# Patient Record
Sex: Female | Born: 1985 | Race: White | Hispanic: Yes | Marital: Married | Smoking: Never smoker
Health system: Southern US, Community
[De-identification: ages and names within clinical notes are randomized; demographics above are authoritative.]

## PROBLEM LIST (undated history)

## (undated) ENCOUNTER — Inpatient Hospital Stay (HOSPITAL_COMMUNITY): Payer: Self-pay

## (undated) DIAGNOSIS — Z789 Other specified health status: Secondary | ICD-10-CM

---

## 2005-01-24 ENCOUNTER — Inpatient Hospital Stay (HOSPITAL_COMMUNITY): Admission: AD | Admit: 2005-01-24 | Discharge: 2005-01-27 | Payer: Self-pay | Admitting: Obstetrics

## 2005-01-25 ENCOUNTER — Encounter (INDEPENDENT_AMBULATORY_CARE_PROVIDER_SITE_OTHER): Payer: Self-pay | Admitting: Specialist

## 2005-12-18 ENCOUNTER — Ambulatory Visit: Payer: Self-pay | Admitting: Internal Medicine

## 2006-03-08 ENCOUNTER — Ambulatory Visit: Payer: Self-pay | Admitting: Internal Medicine

## 2006-05-31 ENCOUNTER — Ambulatory Visit: Payer: Self-pay | Admitting: Internal Medicine

## 2006-08-22 ENCOUNTER — Ambulatory Visit: Payer: Self-pay | Admitting: Internal Medicine

## 2006-11-09 ENCOUNTER — Encounter: Payer: Self-pay | Admitting: Internal Medicine

## 2006-11-13 ENCOUNTER — Ambulatory Visit: Payer: Self-pay | Admitting: Internal Medicine

## 2006-12-04 ENCOUNTER — Encounter (INDEPENDENT_AMBULATORY_CARE_PROVIDER_SITE_OTHER): Payer: Self-pay | Admitting: *Deleted

## 2007-02-04 ENCOUNTER — Ambulatory Visit: Payer: Self-pay | Admitting: Internal Medicine

## 2007-02-05 ENCOUNTER — Telehealth (INDEPENDENT_AMBULATORY_CARE_PROVIDER_SITE_OTHER): Payer: Self-pay | Admitting: *Deleted

## 2007-04-28 ENCOUNTER — Ambulatory Visit: Payer: Self-pay | Admitting: Internal Medicine

## 2007-07-21 ENCOUNTER — Ambulatory Visit: Payer: Self-pay | Admitting: Internal Medicine

## 2007-09-16 ENCOUNTER — Encounter (INDEPENDENT_AMBULATORY_CARE_PROVIDER_SITE_OTHER): Payer: Self-pay | Admitting: Internal Medicine

## 2007-09-16 ENCOUNTER — Ambulatory Visit: Payer: Self-pay | Admitting: Internal Medicine

## 2007-09-16 LAB — CONVERTED CEMR LAB
BUN: 14 mg/dL (ref 6–23)
CO2: 22 meq/L (ref 19–32)
Cholesterol: 132 mg/dL (ref 0–200)
Creatinine, Ser: 0.61 mg/dL (ref 0.40–1.20)
Eosinophils Relative: 1 % (ref 0–5)
GC Probe Amp, Genital: NEGATIVE
Glucose, Bld: 82 mg/dL (ref 70–99)
Glucose, Urine, Semiquant: NEGATIVE
HCT: 42.2 % (ref 36.0–46.0)
Hemoglobin: 14.2 g/dL (ref 12.0–15.0)
Lymphocytes Relative: 35 % (ref 12–46)
Lymphs Abs: 2.1 10*3/uL (ref 0.7–4.0)
Monocytes Absolute: 0.4 10*3/uL (ref 0.1–1.0)
Protein, U semiquant: 30
RDW: 13.1 % (ref 11.5–15.5)
Total Bilirubin: 0.6 mg/dL (ref 0.3–1.2)
Total CHOL/HDL Ratio: 2.2
Triglycerides: 41 mg/dL (ref <150)
VLDL: 8 mg/dL (ref 0–40)
WBC Urine, dipstick: NEGATIVE
WBC: 6.1 10*3/uL (ref 4.0–10.5)

## 2007-10-02 ENCOUNTER — Encounter (INDEPENDENT_AMBULATORY_CARE_PROVIDER_SITE_OTHER): Payer: Self-pay | Admitting: Internal Medicine

## 2007-10-10 ENCOUNTER — Encounter (INDEPENDENT_AMBULATORY_CARE_PROVIDER_SITE_OTHER): Payer: Self-pay | Admitting: Internal Medicine

## 2007-10-13 ENCOUNTER — Ambulatory Visit: Payer: Self-pay | Admitting: Internal Medicine

## 2008-01-05 ENCOUNTER — Ambulatory Visit: Payer: Self-pay | Admitting: Internal Medicine

## 2008-03-29 ENCOUNTER — Ambulatory Visit: Payer: Self-pay | Admitting: Internal Medicine

## 2008-06-21 ENCOUNTER — Ambulatory Visit: Payer: Self-pay | Admitting: Internal Medicine

## 2008-09-14 ENCOUNTER — Ambulatory Visit: Payer: Self-pay | Admitting: Internal Medicine

## 2008-12-06 ENCOUNTER — Ambulatory Visit: Payer: Self-pay | Admitting: Nurse Practitioner

## 2009-02-28 ENCOUNTER — Ambulatory Visit: Payer: Self-pay | Admitting: Internal Medicine

## 2009-05-27 ENCOUNTER — Ambulatory Visit: Payer: Self-pay | Admitting: Physician Assistant

## 2009-06-21 ENCOUNTER — Ambulatory Visit: Payer: Self-pay | Admitting: Internal Medicine

## 2009-08-09 ENCOUNTER — Ambulatory Visit: Payer: Self-pay | Admitting: Internal Medicine

## 2009-08-09 DIAGNOSIS — J9801 Acute bronchospasm: Secondary | ICD-10-CM | POA: Insufficient documentation

## 2009-08-09 DIAGNOSIS — J309 Allergic rhinitis, unspecified: Secondary | ICD-10-CM | POA: Insufficient documentation

## 2009-08-09 DIAGNOSIS — R5381 Other malaise: Secondary | ICD-10-CM

## 2009-08-09 DIAGNOSIS — R5383 Other fatigue: Secondary | ICD-10-CM

## 2009-08-09 LAB — CONVERTED CEMR LAB
Ketones, urine, test strip: NEGATIVE
Nitrite: NEGATIVE
Urobilinogen, UA: 0.2
Whiff Test: NEGATIVE

## 2009-08-11 ENCOUNTER — Encounter (INDEPENDENT_AMBULATORY_CARE_PROVIDER_SITE_OTHER): Payer: Self-pay | Admitting: Internal Medicine

## 2009-08-11 DIAGNOSIS — E079 Disorder of thyroid, unspecified: Secondary | ICD-10-CM | POA: Insufficient documentation

## 2009-08-11 LAB — CONVERTED CEMR LAB
ALT: 13 units/L (ref 0–35)
AST: 18 units/L (ref 0–37)
Albumin: 4.6 g/dL (ref 3.5–5.2)
CO2: 23 meq/L (ref 19–32)
Calcium: 9.5 mg/dL (ref 8.4–10.5)
Chloride: 108 meq/L (ref 96–112)
Creatinine, Ser: 0.72 mg/dL (ref 0.40–1.20)
Eosinophils Absolute: 0.1 10*3/uL (ref 0.0–0.7)
GC Probe Amp, Genital: NEGATIVE
Lymphocytes Relative: 29 % (ref 12–46)
Lymphs Abs: 1.7 10*3/uL (ref 0.7–4.0)
MCV: 88.5 fL (ref 78.0–100.0)
Monocytes Relative: 7 % (ref 3–12)
Neutrophils Relative %: 63 % (ref 43–77)
Potassium: 3.8 meq/L (ref 3.5–5.3)
RBC: 4.44 M/uL (ref 3.87–5.11)
Sodium: 142 meq/L (ref 135–145)
TSH: 0.293 microintl units/mL — ABNORMAL LOW (ref 0.350–4.500)
Total Protein: 7.2 g/dL (ref 6.0–8.3)
WBC: 5.7 10*3/uL (ref 4.0–10.5)

## 2009-08-18 ENCOUNTER — Ambulatory Visit: Payer: Self-pay | Admitting: Internal Medicine

## 2009-08-23 ENCOUNTER — Ambulatory Visit: Payer: Self-pay | Admitting: Internal Medicine

## 2009-12-09 ENCOUNTER — Ambulatory Visit: Payer: Self-pay | Admitting: Internal Medicine

## 2009-12-09 LAB — CONVERTED CEMR LAB
Free T4: 0.96 ng/dL (ref 0.80–1.80)
Preg, Serum: NEGATIVE
T3, Free: 2.9 pg/mL (ref 2.3–4.2)

## 2010-01-13 ENCOUNTER — Telehealth (INDEPENDENT_AMBULATORY_CARE_PROVIDER_SITE_OTHER): Payer: Self-pay | Admitting: Internal Medicine

## 2010-01-13 ENCOUNTER — Encounter (INDEPENDENT_AMBULATORY_CARE_PROVIDER_SITE_OTHER): Payer: Self-pay | Admitting: Internal Medicine

## 2010-01-16 ENCOUNTER — Encounter (INDEPENDENT_AMBULATORY_CARE_PROVIDER_SITE_OTHER): Payer: Self-pay | Admitting: *Deleted

## 2010-02-21 ENCOUNTER — Ambulatory Visit: Payer: Self-pay | Admitting: Internal Medicine

## 2010-02-24 ENCOUNTER — Ambulatory Visit: Payer: Self-pay | Admitting: Internal Medicine

## 2010-04-18 NOTE — Assessment & Plan Note (Signed)
Summary: DISCUSS ABOUT BIRTH CONTROL//GK   Vital Signs:  Patient profile:   25 year old female Weight:      110.06 pounds Temp:     98.7 degrees F Pulse rate:   57 / minute Pulse rhythm:   regular Resp:     16 per minute BP sitting:   99 / 62  (left arm) Cuff size:   regular  Vitals Entered By: Chauncy Passy, SMA CC: Pt. is here b/c she has some concernrs about the Depo shot. She wants to know if it's ok that she has not had a menstrual in over 3 years. She wants to know if the Depo shot affects the sexual desire since she believes she has lost sexual desire. She wants to know if there is another method that she can have/take.  Is Patient Diabetic? No Pain Assessment Patient in pain? no       Does patient need assistance? Functional Status Self care Ambulation Normal   CC:  Pt. is here b/c she has some concernrs about the Depo shot. She wants to know if it's ok that she has not had a menstrual in over 3 years. She wants to know if the Depo shot affects the sexual desire since she believes she has lost sexual desire. She wants to know if there is another method that she can have/take. Marland Kitchen  History of Present Illness: 1.  Contraceptive Management: Questions above regarding Depo provera.  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  Not interested in BCPs.    Current Medications (verified): 1)  Depo-Provera 150 Mg/ml Im Susp (Medroxyprogesterone Acetate) .Marland Kitchen.. 1 Ml Im Q 3 Mos.  Allergies (verified): No Known Drug Allergies  Physical Exam  General:  NAD   Impression & Recommendations:  Problem # 1:  CONTRACEPTIVE MANAGEMENT (ICD-V25.09) Discussed at length her options Not interested in BCPs. May continue the Depoprovera and should not affect fertility.  May have a delay in resuming cycle after stopping--have had some go for a year before resuming. Discussed IUD--[facility]--do not know the charge for that currently. Pt.  needs to follow up for CPP and will let us know her decision then  Complete Medication List: 1)  Depo-provera 150 Mg/ml Im Susp (Medroxyprogesterone acetate) .Marland Kitchen.. 1 ml im q 3 mos.  Patient Instructions: 1)  CPP with Dr. Delrae Alfred in next 2-3 months before due for Depo provera.

## 2010-04-18 NOTE — Assessment & Plan Note (Signed)
Summary: *F/U THROAT & BIRTH CONTROL / NS   Vital Signs:  Patient profile:   25 year old female Menstrual status:  DEPO Weight:      108.8 pounds Temp:     98.3 degrees F oral Pulse rate:   82 / minute BP sitting:   96 / 72  (left arm) Cuff size:   regular  Vitals Entered By: Michelle Nasuti (December 09, 2009 4:13 PM) CC: CONCERNSWITH THROAT AND DEPO Pain Assessment Patient in pain? no       Does patient need assistance? Ambulation Normal     Menstrual Status DEPO Last PAP Result NEGATIVE FOR INTRAEPITHELIAL LESIONS OR MALIGNANCY.   CC:  CONCERNSWITH THROAT AND DEPO.  History of Present Illness: 1.  Birth Control:  Here for Depo provera--missed visit last month when she was due as she did not have eligibility.  Pt. and husband have been using condoms every time had intercourse this past month.  2.  Abnormal TSH:  here to have recheck of thyroid hormones.  3.  Throat always scratchy:  Has been out of allergy meds for some time--could not afford until had eligibility.  Did continue with scratchy throat even when on meds, though improved.  Pt. also notes she has difficulty hitting high notes and difficulty with some chest tightness.  Does not feel like she can get a full deep breath.  No symptoms of heartburn or brash taste in mouth in morning.  Allergies (verified): No Known Drug Allergies  Physical Exam  General:  NAD, mildly hoarse and breathy sounding. Eyes:  No corneal or conjunctival inflammation noted. EOMI. Perrla. Funduscopic exam benign, without hemorrhages, exudates or papilledema. Vision grossly normal. Ears:  Mild cerumen impaction bilaterally Nose:  Mucosa with some swelling and clear discharge Mouth:  pharynx pink and moist.  Unable to see posterior pharynx well enough to evaluate Neck:  No deformities, masses, or tenderness noted.  No thyromegaly or mass Lungs:  Normal respiratory effort, chest expands symmetrically. Lungs are clear to auscultation, no  crackles or wheezes. Heart:  Normal rate and regular rhythm. S1 and S2 normal without gallop, murmur, click, rub or other extra sounds.   Impression & Recommendations:  Problem # 1:  THYROID STIMULATING HORMONE, ABNORMAL (ICD-246.9)  Orders: T-TSH (30865-78469) T-T4, Free (62952-84132) T- * Misc. Laboratory test [phone])  Problem # 2:  ALLERGIC RHINITIS (ICD-477.9)  Her updated medication list for this problem includes:    Xyzal 5 Mg Tabs (Levocetirizine dihydrochloride) .Marland Kitchen... 1 tab by mouth daily    Nasacort Aq 55 Mcg/act Aers (Triamcinolone acetonide) .Marland Kitchen... 2 sprays each nostril daily  Problem # 3:  CONTRACEPTIVE MANAGEMENT (ICD-V25.09) Does not feel she can afford IUD--will stick with depo provera, but will need to check pregnancy first as missed a month. Orders: T-Pregnancy (Serum), Qual.  [phone])  Complete Medication List: 1)  Depo-provera 150 Mg/ml Im Susp (Medroxyprogesterone acetate) .Marland Kitchen.. 1 ml im q 3 mos. 2)  Xyzal 5 Mg Tabs (Levocetirizine dihydrochloride) .Marland Kitchen.. 1 tab by mouth daily 3)  Ventolin Hfa 108 (90 Base) Mcg/act Aers (Albuterol sulfate) .... 2 puffs every 4 hours as needed 4)  Advair Diskus 100-50 Mcg/dose Aepb (Fluticasone-salmeterol) .Marland Kitchen.. 1 inhalation two times a day 5)  Nasacort Aq 55 Mcg/act Aers (Triamcinolone acetonide) .... 2 sprays each nostril daily  Patient Instructions: 1)  Follow up with Dr. Delrae Alfred in 2 months --allergies/hoarseness Prescriptions: NASACORT AQ 55 MCG/ACT AERS (TRIAMCINOLONE ACETONIDE) 2 sprays each nostril daily  #1 x 11  Entered and Authorized by:   Julieanne Manson MD   Signed by:   Julieanne Manson MD on 12/09/2009   Method used:   Faxed to ...       [facility] (retail)       [address] [place], [place]  [address]       Ph: [phone]       Fax: [phone]   RxID:   [id number] XYZAL 5 MG TABS (LEVOCETIRIZINE DIHYDROCHLORIDE) 1 tab by mouth daily  #30 x  11   Entered and Authorized by:   Julieanne Manson MD   Signed by:   Julieanne Manson MD on 12/09/2009   Method used:   Faxed to ...       [facility] (retail)       [address] [place], [place]  [address]       Ph: [phone]       Fax: [phone]   RxID:   [id number]   Appended Document: *F/U THROAT & BIRTH CONTROL / NS Pt. to return on Monday for Depo if urine pregnancy is negative.  Appended Document: *F/U THROAT & BIRTH CONTROL / NS no rx for depo was sent to [facility].

## 2010-04-18 NOTE — Assessment & Plan Note (Signed)
Summary: DEPO INJCETION/ JUNE 2ND//GK   Nurse Visit   Allergies: No Known Drug Allergies  Medication Administration  Injection # 1:    Medication: Depo-Provera 150mg     Diagnosis: CONTRACEPTIVE MANAGEMENT (ICD-V25.09)    Route: IM    Site: R deltoid    Exp Date: 03/19/2011    Lot #: [id number]    Mfr: [employer]    Comments: [phone] next shot due 11/09/09    Given by: Vesta Mixer CMA (August 18, 2009 3:57 PM)  Orders Added: 1)  Est. Patient Nurse visit [09003] 2)  Depo-Provera 150mg  [J1055] 3)  Admin of Therapeutic Inj  intramuscular or subcutaneous [96372]   Medication Administration  Injection # 1:    Medication: Depo-Provera 150mg     Diagnosis: CONTRACEPTIVE MANAGEMENT (ICD-V25.09)    Route: IM    Site: R deltoid    Exp Date: 03/19/2011    Lot #: [id number]    Mfr: [employer]    Comments: [phone] next shot due 11/09/09    Given by: Vesta Mixer CMA (August 18, 2009 3:57 PM)  Orders Added: 1)  Est. Patient Nurse visit [09003] 2)  Depo-Provera 150mg  [J1055] 3)  Admin of Therapeutic Inj  intramuscular or subcutaneous [30865]

## 2010-04-18 NOTE — Progress Notes (Signed)
Summary: Office Visit//depression screening  Office Visit//depression screening   Imported By: Arta Bruce 09/26/2009 11:21:12  _____________________________________________________________________  External Attachment:    Type:   Image     Comment:   External Document

## 2010-04-18 NOTE — Assessment & Plan Note (Signed)
Summary: FOLLOW UP VISIT 1 1/2 WEEKS///BC   Vital Signs:  Patient profile:   25 year old female Weight:      106 pounds BMI:     20.43 Temp:     98.4 degrees F  Vitals Entered By: Vesta Mixer CMA (August 23, 2009 4:01 PM) CC: Listen to lungs only as card is expired. Is Patient Diabetic? No  Does patient need assistance? Ambulation Normal   CC:  Listen to lungs only as card is expired.Marland Kitchen  History of Present Illness: 1.  Allergies with bronchospasm:  Ran out of all samples 4 days ago.  Her fatigue and breathing symptoms were very much better while taking.  Out of eligibility now, so will be difficult to get meds filled.  2.  Low TSH with normal free T4 and T3.  Has an appt. to recheck TSH 8/10  Allergies: No Known Drug Allergies  Physical Exam  General:  NAD Eyes:  No corneal or conjunctival inflammation noted. EOMI. Perrla. Funduscopic exam benign, without hemorrhages, exudates or papilledema. Vision grossly normal. Ears:  External ear exam shows no significant lesions or deformities.  Otoscopic examination reveals clear canals, tympanic membranes are intact bilaterally without bulging, retraction, inflammation or discharge. Hearing is grossly normal bilaterally. Nose:  External nasal examination shows no deformity or inflammation. Nasal mucosa are pink and moist without lesions or exudates. Mouth:  pharynx pink and moist.   Neck:  No deformities, masses, or tenderness noted. Lungs:  Normal respiratory effort, chest expands symmetrically. Lungs are clear to auscultation, no crackles or wheezes. Heart:  Normal rate and regular rhythm. S1 and S2 normal without gallop, murmur, click, rub or other extra sounds.   Impression & Recommendations:  Problem # 1:  ACUTE BRONCHOSPASM (ICD-519.11) Resolved Samples of Advair and Ventolin for 1 month given--to call at end of month to refill  Problem # 2:  ALLERGIC RHINITIS (ICD-477.9) Controlled--discussed pt. should get Claritin otc  until has eligibility Her updated medication list for this problem includes:    Claritin 10 Mg Tabs (Loratadine) .Marland Kitchen... 1 tab by mouth daily  Problem # 3:  THYROID STIMULATING HORMONE, ABNORMAL (ICD-246.9) Recheck in August  Complete Medication List: 1)  Depo-provera 150 Mg/ml Im Susp (Medroxyprogesterone acetate) .Marland Kitchen.. 1 ml im q 3 mos. 2)  Claritin 10 Mg Tabs (Loratadine) .Marland Kitchen.. 1 tab by mouth daily 3)  Ventolin Hfa 108 (90 Base) Mcg/act Aers (Albuterol sulfate) .... 2 puffs every 4 hours as needed 4)  Advair Diskus 100-50 Mcg/dose Aepb (Fluticasone-salmeterol) .Marland Kitchen.. 1 inhalation two times a day  Patient Instructions: 1)  Eligibility appt. 2)  Appt with Dr. Delrae Alfred after eligibility appt. Prescriptions: VENTOLIN HFA 108 (90 BASE) MCG/ACT AERS (ALBUTEROL SULFATE) 2 puffs every 4 hours as needed  #1 x 0   Entered and Authorized by:   Julieanne Manson MD   Signed by:   Julieanne Manson MD on 08/23/2009   Method used:   Samples Given   RxID:   [id number] ADVAIR DISKUS 100-50 MCG/DOSE AEPB (FLUTICASONE-SALMETEROL) 1 inhalation two times a day  #1 month x 0   Entered and Authorized by:   Julieanne Manson MD   Signed by:   Julieanne Manson MD on 08/23/2009   Method used:   Samples Given   RxID:   [id number]

## 2010-04-18 NOTE — Letter (Signed)
Summary: *HSN Results Follow up  [facility]  [address] [place], [place] [address]   Phone: [phone]  Fax: [phone]      01/16/2010   [facility] [address] [place], [place]  [address]   Dear  Ms. Ileen MENDEZ,                            ____S.Drinkard,FNP   ____D. Gore,FNP       ____B. McPherson,MD   ____V. Rankins,MD    ____E. Mulberry,MD    ____N. Daphine Deutscher, FNP  ____D. Reche Dixon, MD    ____K. Philipp Deputy, MD    ____Other     This letter is to inform you that your recent test(s):  _______Pap Smear    _______Lab Test     _______X-ray    _______ is within acceptable limits  _______ requires a medication change  _______ requires a follow-up lab visit  _______ requires a follow-up visit with your Murphy Duzan   Comments:  We have been trying to reach you at [phone].  Please give the office a call.       _________________________________________________________ If you have any questions, please contact our office                     Sincerely,  [place] Shannon [facility]

## 2010-04-18 NOTE — Letter (Signed)
Summary: *HSN Results Follow up  [facility]  [address] [place], [place] [address]   Phone: [phone]  Fax: [phone]      01/13/2010   [facility] [address] [place], [place]  [address]   Dear  Ms. Diana [place],                            ____S.Drinkard,FNP   ____D. Gore,FNP       ____B. McPherson,MD   ____V. Rankins,MD    _X___E. Chanteria Haggard,MD    ____N. Daphine Deutscher, FNP  ____D. Reche Dixon, MD    ____K. Philipp Deputy, MD    ____Other     This letter is to inform you that your recent test(s):  _______Pap Smear    ____X___Lab Test     _______X-ray    ___X____ is within acceptable limits  _______ requires a medication change  _______ requires a follow-up lab visit  _______ requires a follow-up visit with your provider   Comments:  thyroid testing is normal now.  Pregancy test was negative at last visit.       _________________________________________________________ If you have any questions, please contact our office                     Sincerely,  Diana Manson MD [facility]

## 2010-04-18 NOTE — Assessment & Plan Note (Addendum)
Summary: allergies, hoarseness   Vital Signs:  Patient profile:   25 year old female Menstrual status:  DEPO LMP:     02/07/2010 Weight:      106.4 pounds Temp:     97.5 degrees F oral Pulse rate:   52 / minute Pulse rhythm:   regular Resp:     16 per minute BP sitting:   90 / 62  (right arm) Cuff size:   regular  Vitals Entered By: Hale Drone CMA (February 21, 2010 4:28 PM) CC: 2 month f/u on allergies and throat/hoarsness. Needs Rx for her Depo.  Is Patient Diabetic? No Pain Assessment Patient in pain? no       Does patient need assistance? Functional Status Self care Ambulation Normal LMP (date): 02/07/2010     Enter LMP: 02/07/2010 Last PAP Result NEGATIVE FOR INTRAEPITHELIAL LESIONS OR MALIGNANCY.   CC:  2 month f/u on allergies and throat/hoarsness. Needs Rx for her Depo. Marland Kitchen  History of Present Illness: 1.  Allergies:  doing well on allergy meds.  Able to sing well, get a deep breath, etc.  Generally, does not have problems during winter months--discussed stopping after first hard freeze and restarting when symptoms recur.  2.    Current Medications (verified): 1)  Depo-Provera 150 Mg/ml Im Susp (Medroxyprogesterone Acetate) .Marland Kitchen.. 1 Ml Im Q 3 Mos. 2)  Xyzal 5 Mg Tabs (Levocetirizine Dihydrochloride) .Marland Kitchen.. 1 Tab By Mouth Daily 3)  Ventolin Hfa 108 (90 Base) Mcg/act Aers (Albuterol Sulfate) .... 2 Puffs Every 4 Hours As Needed 4)  Advair Diskus 100-50 Mcg/dose Aepb (Fluticasone-Salmeterol) .Marland Kitchen.. 1 Inhalation Two Times A Day 5)  Nasacort Aq 55 Mcg/act Aers (Triamcinolone Acetonide) .... 2 Sprays Each Nostril Daily  Allergies (verified): No Known Drug Allergies  Physical Exam  Eyes:  No corneal or conjunctival inflammation noted. EOMI. Perrla. Funduscopic exam benign, without hemorrhages, exudates or papilledema. Vision grossly normal. Ears:  External ear exam shows no significant lesions or deformities.  Otoscopic examination reveals clear canals, tympanic  membranes are intact bilaterally without bulging, retraction, inflammation or discharge. Hearing is grossly normal bilaterally. Nose:  External nasal examination shows no deformity or inflammation. Nasal mucosa are pink and moist without lesions or exudates. Mouth:  pharynx pink and moist.   Neck:  No deformities, masses, or tenderness noted. Lungs:  Normal respiratory effort, chest expands symmetrically. Lungs are clear to auscultation, no crackles or wheezes. Heart:  Normal rate and regular rhythm. S1 and S2 normal without gallop, murmur, click, rub or other extra sounds.   Impression & Recommendations:  Problem # 1:  CONTRACEPTIVE MANAGEMENT (ICD-V25.09) To hopefully pick up Depo provera tomorrow and come here to administer. Orders: Urine Pregnancy Test  [phone]) To call for CPP end of May or beginning of June  Problem # 2:  ALLERGIC RHINITIS (ICD-477.9) Controlled Her updated medication list for this problem includes:    Xyzal 5 Mg Tabs (Levocetirizine dihydrochloride) .Marland Kitchen... 1 tab by mouth daily    Nasacort Aq 55 Mcg/act Aers (Triamcinolone acetonide) .Marland Kitchen... 2 sprays each nostril daily  Complete Medication List: 1)  Depo-provera 150 Mg/ml Im Susp (Medroxyprogesterone acetate) .Marland Kitchen.. 1 ml im q 3 mos. 2)  Xyzal 5 Mg Tabs (Levocetirizine dihydrochloride) .Marland Kitchen.. 1 tab by mouth daily 3)  Ventolin Hfa 108 (90 Base) Mcg/act Aers (Albuterol sulfate) .... 2 puffs every 4 hours as needed 4)  Advair Diskus 100-50 Mcg/dose Aepb (Fluticasone-salmeterol) .Marland Kitchen.. 1 inhalation two times a day 5)  Nasacort Aq 55 Mcg/act  Aers (Triamcinolone acetonide) .... 2 sprays each nostril daily  Other Orders: Flu Vaccine 24yrs + (43329) Admin 1st Vaccine (51884)  Patient Instructions: 1)  Call pharmacy tomorrow and see if medication available for pick up--bring to clinic with this note to have Depo injected Prescriptions: DEPO-PROVERA 150 MG/ML IM SUSP (MEDROXYPROGESTERONE ACETATE) 1 ml IM q 3 mos.  #1 x 1    Entered and Authorized by:   Julieanne Manson MD   Signed by:   Julieanne Manson MD on 02/21/2010   Method used:   Faxed to ...       [facility] (retail)       [address] [place], [place]  [address]       Ph: [phone]       Fax: [phone]   RxID:   [id number]    Orders Added: 1)  Flu Vaccine 58yrs + [90658] 2)  Admin 1st Vaccine [90471] 3)  Urine Pregnancy Test  [81025] 4)  Est. Patient Level II [61607]   Immunizations Administered:  Influenza Vaccine # 1:    Vaccine Type: Fluvax 3+    Site: left deltoid    Mfr: [employer]    Dose: 0.5 ml    Route: IM    Given by: Hale Drone CMA    Exp. Date: 09/16/2010    Lot #: [id number]    VIS given: 10/11/09 version given February 21, 2010.  Flu Vaccine Consent Questions:    Do you have a history of severe allergic reactions to this vaccine? no    Any prior history of allergic reactions to egg and/or gelatin? no    Do you have a sensitivity to the preservative Thimersol? no    Do you have a past history of Guillan-Barre Syndrome? no    Do you currently have an acute febrile illness? no    Have you ever had a severe reaction to latex? no    Vaccine information given and explained to patient? yes    Are you currently pregnant? no   Immunizations Administered:  Influenza Vaccine # 1:    Vaccine Type: Fluvax 3+    Site: left deltoid    Mfr: [employer]    Dose: 0.5 ml    Route: IM    Given by: Hale Drone CMA    Exp. Date: 09/16/2010    Lot #: [id number]    VIS given: 10/11/09 version given February 21, 2010.   Appended Document: allergies, hoarseness     Allergies: No Known Drug Allergies   Complete Medication List: 1)  Depo-provera 150 Mg/ml Im Susp (Medroxyprogesterone acetate) .Marland Kitchen.. 1 ml im q 3 mos. 2)  Xyzal 5 Mg Tabs (Levocetirizine dihydrochloride) .Marland Kitchen.. 1 tab by mouth daily 3)  Ventolin Hfa 108 (90 Base) Mcg/act Aers (Albuterol sulfate)  .... 2 puffs every 4 hours as needed 4)  Advair Diskus 100-50 Mcg/dose Aepb (Fluticasone-salmeterol) .Marland Kitchen.. 1 inhalation two times a day 5)  Nasacort Aq 55 Mcg/act Aers (Triamcinolone acetonide) .... 2 sprays each nostril daily      Laboratory Results   Urine Tests  Date/Time Received: February 21, 2010 5:43 PM     Urine HCG: negative

## 2010-04-18 NOTE — Assessment & Plan Note (Signed)
Summary: cpp///////kt   Vital Signs:  Patient profile:   25 year old Carr Weight:      107 pounds Temp:     98.3 degrees F oral Pulse rate:   58 / minute Pulse rhythm:   regular Resp:     20 per minute BP sitting:   90 / 68  (left arm) Cuff size:   small  Vitals Entered By: Linzie Collin CC: rotuine physical, w/pap Is Patient Diabetic? No  Does patient need assistance? Functional Status Self care Ambulation Normal   CC:  rotuine physical and w/pap.  History of Present Illness: 25 yo Carr here for CPP.  Concerns:  1.  Fatigue:  Has felt this way for 1 1/2 months.  Up to date with Depo provera--unlikely to be pregnant.  Feels well rested when awakens, then tired later in day.  Did not have heavy periods before resumed Depo provera.  Thinks she has been eating fine.  No changes in skin.  Hair may be falling out a bit.  No diarrhea, sometimes constipation.  Weight vacillates between 106 and 110.  Not stressed about anything currently.  Current Medications (verified): 1)  Depo-Provera 150 Mg/ml Im Susp (Medroxyprogesterone Acetate) .Marland Kitchen.. 1 Ml Im Q 3 Mos.  Allergies (verified): No Known Drug Allergies  Past History:  Past Medical History: VAGINOSIS, BACTERIAL (ICD-616.10) ROUTINE GYNECOLOGICAL EXAMINATION (ICD-V72.31) HEALTH MAINTENANCE EXAM (ICD-V70.0) CONTRACEPTIVE MANAGEMENT (ICD-V25.09)    Past Surgical History: None  Family History: Reviewed history from 09/16/2007 and no changes required. Mother, 56:  chronic back pain Father , 15:  Unknown breathing problem--does not go to doctor 11 siblings:  all healthy Daughter, 4 years:  healthy  Social History: Reviewed history from 09/16/2007 and no changes required. 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 Lives at home with husband, daughter, sister-in-law, and niece  Review of Systems General:  Energy poor.. Eyes:  Some difficulty with night driving.  Cannot see very far in the  distance.. ENT:  Denies decreased hearing. CV:  Denies chest pain or discomfort. Resp:  Denies shortness of breath. GI:  Denies bloody stools and dark tarry stools; Has had blood in stool before--but only when very constipated. GU:  Denies discharge. MS:  Denies joint pain, joint redness, and joint swelling. Derm:  Denies rash. Psych:  Denies anxiety and depression.  Physical Exam  General:  Well-developed,well-nourished,in no acute distress; alert,appropriate and cooperative throughout examination Head:  Normocephalic and atraumatic without obvious abnormalities. No apparent alopecia or balding. Eyes:  No corneal, but mild conjunctival inflammation noted. EOMI. Perrla. Funduscopic exam benign, without hemorrhages, exudates or papilledema. Vision grossly normal. Ears:  External ear exam shows no significant lesions or deformities.  Otoscopic examination reveals clear canals, tympanic membranes are intact bilaterally without bulging, retraction, inflammation or discharge. Hearing is grossly normal bilaterally. Nose:  nasal dischargemucosal pallor and mucosal edema.   Mouth:  Cobbling of posterior pharynx.  No tonsillar erythema or exudategood dentition.   Neck:  No deformities, masses, or tenderness noted. Breasts:  No mass, nodules, thickening, tenderness, bulging, retraction, inflamation, nipple discharge or skin changes noted.   Lungs:  Scattered mild wheezing with good air exchange Heart:  Normal rate and regular rhythm. S1 and S2 normal without gallop, murmur, click, rub or other extra sounds. Abdomen:  Bowel sounds positive,abdomen soft and non-tender without masses, organomegaly or hernias noted. Genitalia:  Pelvic Exam:        External: normal Carr genitalia without lesions or masses  Vagina: normal without lesions or masses        Cervix: Possible extropion at superior aspect of os vs. very small polyp--beefy red        Adnexa: normal bimanual exam without masses or  fullness        Uterus: normal by palpation        Pap smear: performed Msk:  No deformity or scoliosis noted of thoracic or lumbar spine.   Pulses:  R and L carotid,radial,femoral,dorsalis pedis and posterior tibial pulses are full and equal bilaterally Extremities:  No clubbing, cyanosis, edema, or deformity noted with normal full range of motion of all joints.   Neurologic:  No cranial nerve deficits noted. Station and gait are normal. Plantar reflexes are down-going bilaterally. DTRs are symmetrical throughout. Sensory, motor and coordinative functions appear intact. Skin:  Intact without suspicious lesions or rashes Cervical Nodes:  No lymphadenopathy noted Axillary Nodes:  No palpable lymphadenopathy Inguinal Nodes:  No significant adenopathy Psych:  Cognition and judgment appear intact. Alert and cooperative with normal attention span and concentration. No apparent delusions, illusions, hallucinations   Impression & Recommendations:  Problem # 1:  ROUTINE GYNECOLOGICAL EXAMINATION (ICD-V72.31) Encouraged more dairy intake and exerices vs.  Calcium and vitamin D supplementation. Orders: UA Dipstick w/o Micro (manual) (29562) KOH/ WET Mount [id number]) Pap Smear, Thin Prep ( Collection of) [id number]) T-Syphilis Test (RPR) [phone]) T-HIV Antibody  (Reflex) [id number]) T-Pap Smear, Thin Prep (36644) T- GC Chlamydia (03474)  Problem # 2:  FATIGUE (ICD-780.79) Check labs, but feel related to her allergies and bronchospasm Orders: T-Comprehensive Metabolic Panel ([id number]) T-CBC w/Diff (43329-51884) T-TSH (16606-30160)  Problem # 3:  ALLERGIC RHINITIS (ICD-477.9) Samples given Her updated medication list for this problem includes:    Claritin 10 Mg Tabs (Loratadine) .Marland Kitchen... 1 tab by mouth daily  Problem # 4:  ACUTE BRONCHOSPASM (ICD-519.11) Samples of Advair and Proventil HFA given  Complete Medication List: 1)  Depo-provera 150 Mg/ml Im Susp (Medroxyprogesterone acetate)  .Marland Kitchen.. 1 ml im q 3 mos. 2)  Claritin 10 Mg Tabs (Loratadine) .Marland Kitchen.. 1 tab by mouth daily 3)  Ventolin Hfa 108 (90 Base) Mcg/act Aers (Albuterol sulfate) .... 2 puffs every 4 hours as needed 4)  Advair Diskus 100-50 Mcg/dose Aepb (Fluticasone-salmeterol) .Marland Kitchen.. 1 inhalation two times a day  Patient Instructions: 1)  Calcium con Vitamina D  500 mg/200 micrograms  una pastilla dos veces al dia 2)  Follow up with Dr. Delrae Alfred in 1 1/2 weeks Prescriptions: ADVAIR DISKUS 100-50 MCG/DOSE AEPB (FLUTICASONE-SALMETEROL) 1 inhalation two times a day  #1 x 0   Entered and Authorized by:   Julieanne Manson MD   Signed by:   Julieanne Manson MD on 08/11/2009   Method used:   Samples Given   RxID:   [id number] VENTOLIN HFA 108 (90 BASE) MCG/ACT AERS (ALBUTEROL SULFATE) 2 puffs every 4 hours as needed  #1 x 0   Entered and Authorized by:   Julieanne Manson MD   Signed by:   Julieanne Manson MD on 08/11/2009   Method used:   Samples Given   RxID:   [id number] CLARITIN 10 MG TABS (LORATADINE) 1 tab by mouth daily  #10 x 0   Entered and Authorized by:   Julieanne Manson MD   Signed by:   Julieanne Manson MD on 08/11/2009   Method used:   Samples Given   RxID:   [id number]    Preventive Care Screening  Prior Values:    Pap Smear:  Normal (  12/18/2005)    Last Tetanus Booster:  Tdap (State) (09/16/2007)     SBE:  No LMP:  no longer having with Depo Provera. Osteoprevention:  No milk, does occasionally have yogurt and cheese.  Does exercise regularly.  Laboratory Results   Urine Tests    Routine Urinalysis   Color: yellow Appearance: Clear Glucose: negative   (Normal Range: Negative) Bilirubin: negative   (Normal Range: Negative) Ketone: negative   (Normal Range: Negative) Spec. Gravity: >=1.030   (Normal Range: 1.003-1.035) Blood: negative   (Normal Range: Negative) pH: 5.5   (Normal Range: 5.0-8.0) Protein: 30   (Normal Range: Negative) Urobilinogen: 0.2    (Normal Range: 0-1) Nitrite: negative   (Normal Range: Negative) Leukocyte Esterace: negative   (Normal Range: Negative)      Wet Mount Source: vaginal WBC/hpf: 1-5 Bacteria/hpf: 1+ Clue cells/hpf: none  Negative whiff Yeast/hpf: none Wet Mount KOH: Negative Trichomonas/hpf: none   Laboratory Results   Urine Tests    Routine Urinalysis   Color: yellow Appearance: Clear Glucose: negative   (Normal Range: Negative) Bilirubin: negative   (Normal Range: Negative) Ketone: negative   (Normal Range: Negative) Spec. Gravity: >=1.030   (Normal Range: 1.003-1.035) Blood: negative   (Normal Range: Negative) pH: 5.5   (Normal Range: 5.0-8.0) Protein: 30   (Normal Range: Negative) Urobilinogen: 0.2   (Normal Range: 0-1) Nitrite: negative   (Normal Range: Negative) Leukocyte Esterace: negative   (Normal Range: Negative)      Wet Mount/KOH  Negative whiff

## 2010-04-18 NOTE — Progress Notes (Signed)
  Phone Note Outgoing Call   Summary of Call: Did she ever get Depo--if not, and still wants, have her come in for urine pregnancy and if negative, can give Depo--I did not realize when she was here that we had to send prescription for self pay to [facility] pharmacy and so apparently my order to give if pregnancy test was negative was not followed through. Initial call taken by: Julieanne Manson MD,  January 13, 2010 12:45 PM  Follow-up for Phone Call        number is disconnected. Marland Kitchen.[place] Shannon  January 13, 2010 4:44 PM  number is disconnected.Marland KitchenMarland KitchenMarland KitchenMarland Kitchen will mail letter.... [place] Shannon  January 16, 2010 5:06 PM     New/Updated Medications: DEPO-PROVERA 150 MG/ML IM SUSP (MEDROXYPROGESTERONE ACETATE) 1 ml IM q 3 mos. Prescriptions: DEPO-PROVERA 150 MG/ML IM SUSP (MEDROXYPROGESTERONE ACETATE) 1 ml IM q 3 mos.  #1 x 1   Entered and Authorized by:   Julieanne Manson MD   Signed by:   Julieanne Manson MD on 01/13/2010   Method used:   Faxed to ...       [facility] (retail)       [address] [place], [place]  [address]       Ph: [phone]       Fax: [phone]   RxID:   [id number]

## 2010-05-19 ENCOUNTER — Encounter (INDEPENDENT_AMBULATORY_CARE_PROVIDER_SITE_OTHER): Payer: Self-pay | Admitting: Internal Medicine

## 2011-01-09 ENCOUNTER — Emergency Department (HOSPITAL_COMMUNITY): Payer: Self-pay

## 2011-01-09 ENCOUNTER — Emergency Department (HOSPITAL_COMMUNITY)
Admission: EM | Admit: 2011-01-09 | Discharge: 2011-01-09 | Disposition: A | Discharge status: Home / Self Care | Payer: Self-pay | Attending: Emergency Medicine | Admitting: Emergency Medicine

## 2011-01-09 DIAGNOSIS — J3489 Other specified disorders of nose and nasal sinuses: Secondary | ICD-10-CM | POA: Insufficient documentation

## 2011-01-09 DIAGNOSIS — R0609 Other forms of dyspnea: Secondary | ICD-10-CM | POA: Insufficient documentation

## 2011-01-09 DIAGNOSIS — R05 Cough: Secondary | ICD-10-CM | POA: Insufficient documentation

## 2011-01-09 DIAGNOSIS — J189 Pneumonia, unspecified organism: Secondary | ICD-10-CM | POA: Insufficient documentation

## 2011-01-09 DIAGNOSIS — R599 Enlarged lymph nodes, unspecified: Secondary | ICD-10-CM | POA: Insufficient documentation

## 2011-01-09 DIAGNOSIS — R0989 Other specified symptoms and signs involving the circulatory and respiratory systems: Secondary | ICD-10-CM | POA: Insufficient documentation

## 2011-01-09 DIAGNOSIS — R509 Fever, unspecified: Secondary | ICD-10-CM | POA: Insufficient documentation

## 2011-01-09 DIAGNOSIS — R070 Pain in throat: Secondary | ICD-10-CM | POA: Insufficient documentation

## 2011-01-09 DIAGNOSIS — J45909 Unspecified asthma, uncomplicated: Secondary | ICD-10-CM | POA: Insufficient documentation

## 2011-01-09 DIAGNOSIS — R059 Cough, unspecified: Secondary | ICD-10-CM | POA: Insufficient documentation

## 2011-03-20 NOTE — L&D Delivery Note (Signed)
Delivery Note At 6:43 AM a viable female was delivered via Vaginal, Spontaneous Delivery (Presentation: LOA).     Placenta status: delivered with cord traction, intact.  Cord: 3 vessels with the following complications: none .    Anesthesia: Epidural, local Episiotomy: none Lacerations: second degree Suture Repair: 2.0 vicryl Est. Blood Loss (mL): 150 ml  Mom to postpartum.  Baby to nursery-stable.  JACKSON-MOORE,Dimitri Dsouza A 11/04/2011, 7:04 AM

## 2011-03-31 ENCOUNTER — Inpatient Hospital Stay (HOSPITAL_COMMUNITY)
Admission: AD | Admit: 2011-03-31 | Discharge: 2011-03-31 | Disposition: A | Discharge status: Home / Self Care | Payer: Self-pay | Source: Ambulatory Visit | Attending: Obstetrics & Gynecology | Admitting: Obstetrics & Gynecology

## 2011-03-31 ENCOUNTER — Encounter (HOSPITAL_COMMUNITY): Payer: Self-pay

## 2011-03-31 DIAGNOSIS — R350 Frequency of micturition: Secondary | ICD-10-CM

## 2011-03-31 DIAGNOSIS — O210 Mild hyperemesis gravidarum: Secondary | ICD-10-CM | POA: Insufficient documentation

## 2011-03-31 DIAGNOSIS — O219 Vomiting of pregnancy, unspecified: Secondary | ICD-10-CM

## 2011-03-31 DIAGNOSIS — R1013 Epigastric pain: Secondary | ICD-10-CM | POA: Insufficient documentation

## 2011-03-31 LAB — POCT PREGNANCY, URINE: Preg Test, Ur: POSITIVE

## 2011-03-31 LAB — URINALYSIS, ROUTINE W REFLEX MICROSCOPIC
Bilirubin Urine: NEGATIVE
Glucose, UA: NEGATIVE mg/dL
Hgb urine dipstick: NEGATIVE
Protein, ur: NEGATIVE mg/dL
Urobilinogen, UA: 0.2 mg/dL (ref 0.0–1.0)

## 2011-03-31 MED ORDER — PANTOPRAZOLE SODIUM 40 MG PO TBEC
40.0000 mg | DELAYED_RELEASE_TABLET | Freq: Once | ORAL | Status: AC
  Administered 2011-03-31: 40 mg via ORAL
  Filled 2011-03-31: qty 1

## 2011-03-31 MED ORDER — ONDANSETRON HCL 4 MG PO TABS: 4.0000 mg | ORAL_TABLET | Freq: Three times a day (TID) | ORAL | Status: AC | PRN

## 2011-03-31 MED ORDER — ONDANSETRON 8 MG PO TBDP
8.0000 mg | ORAL_TABLET | Freq: Once | ORAL | Status: AC
  Administered 2011-03-31: 8 mg via ORAL
  Filled 2011-03-31: qty 1

## 2011-03-31 MED ORDER — ACETAMINOPHEN 500 MG PO TABS
1000.0000 mg | ORAL_TABLET | Freq: Once | ORAL | Status: AC
  Administered 2011-03-31: 1000 mg via ORAL
  Filled 2011-03-31: qty 2

## 2011-03-31 MED ORDER — PROMETHAZINE HCL 25 MG PO TABS: 25.0000 mg | ORAL_TABLET | Freq: Four times a day (QID) | ORAL | Status: AC | PRN

## 2011-03-31 NOTE — Progress Notes (Signed)
Patient is here with c/o n/v and headache for 3 weeks. She states that she has upper back and abdominal pain at times. Denies any vaginal bleeding or discharge.

## 2011-03-31 NOTE — ED Provider Notes (Signed)
History   Diana Carr is a 26 y.o. year old G2P1 female at [redacted]w[redacted]d weeks gestation who presents to MAU reporting N/V 5-4 x/day x 3 weeks, mild epigastric pain w/ no relationship to eating, intermittent HA's, minimal now. She denies VB or cramping.   CSN: [id number]  Arrival date & time 03/31/11  1907   None     Chief Complaint  Patient presents with  . Emesis  . Headache  . Nausea    (Consider location/radiation/quality/duration/timing/severity/associated sxs/prior treatment) HPI  History reviewed. No pertinent past medical history.  History reviewed. No pertinent past surgical history.  History reviewed. No pertinent family history.  History  Substance Use Topics  . Smoking status: Not on file  . Smokeless tobacco: Not on file  . Alcohol Use: No    OB History    Grav Para Term Preterm Abortions TAB SAB Ect Mult Living   2 1        1       Review of Systems  Constitutional: Negative for fever and chills.  HENT: Negative for congestion.   Eyes: Negative for visual disturbance.  Cardiovascular: Negative for chest pain.  Gastrointestinal: Positive for nausea, vomiting and abdominal pain. Negative for diarrhea and constipation. Blood in stool: epigastric.  Genitourinary: Positive for frequency. Negative for dysuria, hematuria, flank pain, vaginal bleeding, vaginal discharge and pelvic pain.  Neurological: Positive for headaches. Negative for dizziness.    Allergies  Review of patient's allergies indicates no known allergies.  Home Medications  No current outpatient prescriptions on file.  BP 113/51  Pulse 65  Temp(Src) 98.4 F (36.9 C) (Oral)  Resp 16  SpO2 99%  LMP 01/27/2011  Physical Exam  Nursing note and vitals reviewed. Constitutional: She is oriented to person, place, and time. She appears well-developed and well-nourished. No distress.  HENT:  Head: Normocephalic.  Eyes: Pupils are equal, round, and reactive to light.  Cardiovascular: Normal  rate.   Pulmonary/Chest: Effort normal.  Abdominal: Soft. She exhibits no distension and no mass. There is tenderness (mild epigastric tenderness). There is no guarding.  Neurological: She is alert and oriented to person, place, and time.  Skin: Skin is warm and dry. No pallor.  Psychiatric: She has a normal mood and affect.  FHR 155 by informal BS [place]  ED Course  Procedures (including critical care time) Nausea resolved w/ Zofran, Prontonix. HA resolved 2/ Tylenol. Tolerating PO's  Results for orders placed during the hospital encounter of 03/31/11 (from the past 24 hour(s))  URINALYSIS, ROUTINE W REFLEX MICROSCOPIC     Status: Abnormal   Collection Time   03/31/11  7:17 PM      Component Value Range   Color, Urine YELLOW  YELLOW    APPearance CLEAR  CLEAR    Specific Gravity, Urine 1.020  1.005 - 1.030    pH 6.5  5.0 - 8.0    Glucose, UA NEGATIVE  NEGATIVE (mg/dL)   Hgb urine dipstick NEGATIVE  NEGATIVE    Bilirubin Urine NEGATIVE  NEGATIVE    Ketones, ur 15 (*) NEGATIVE (mg/dL)   Protein, ur NEGATIVE  NEGATIVE (mg/dL)   Urobilinogen, UA 0.2  0.0 - 1.0 (mg/dL)   Nitrite NEGATIVE  NEGATIVE    Leukocytes, UA NEGATIVE  NEGATIVE   POCT PREGNANCY, URINE     Status: Normal   Collection Time   03/31/11  7:24 PM      Component Value Range   Preg Test, Ur POSITIVE     MDM  Assessment: 1. 9 week IUP 2. N/V of pregnancy controlled w/ PO meds. 3. Generalized HA controlled w/ Tylenol  Plan: 1. D/C home 2. Rx Phenergan and Zofran 3. OTC Tylenol and Pepcid PRN 4. Start PNC 5. F/U in MAU PRN from worsening Sx.  Dorathy Kinsman 03/31/2011 11:09 PM

## 2011-04-01 LAB — URINE CULTURE
Colony Count: NO GROWTH
Culture  Setup Time: 201301131209
Culture: NO GROWTH

## 2011-04-12 ENCOUNTER — Other Ambulatory Visit: Payer: Self-pay | Admitting: Advanced Practice Midwife

## 2011-07-24 ENCOUNTER — Other Ambulatory Visit (HOSPITAL_COMMUNITY): Payer: Self-pay | Admitting: Obstetrics

## 2011-07-24 ENCOUNTER — Other Ambulatory Visit: Payer: Self-pay

## 2011-07-24 DIAGNOSIS — IMO0002 Reserved for concepts with insufficient information to code with codable children: Secondary | ICD-10-CM

## 2011-08-01 ENCOUNTER — Ambulatory Visit (HOSPITAL_COMMUNITY)
Admission: RE | Admit: 2011-08-01 | Discharge: 2011-08-01 | Disposition: A | Discharge status: Home / Self Care | Payer: Self-pay | Source: Ambulatory Visit | Attending: Obstetrics | Admitting: Obstetrics

## 2011-08-01 VITALS — BP 107/58 | HR 53 | Wt 124.0 lb

## 2011-08-01 DIAGNOSIS — Z8751 Personal history of pre-term labor: Secondary | ICD-10-CM | POA: Insufficient documentation

## 2011-08-01 DIAGNOSIS — Z363 Encounter for antenatal screening for malformations: Secondary | ICD-10-CM | POA: Insufficient documentation

## 2011-08-01 DIAGNOSIS — O358XX0 Maternal care for other (suspected) fetal abnormality and damage, not applicable or unspecified: Secondary | ICD-10-CM | POA: Insufficient documentation

## 2011-08-01 DIAGNOSIS — O09299 Supervision of pregnancy with other poor reproductive or obstetric history, unspecified trimester: Secondary | ICD-10-CM | POA: Insufficient documentation

## 2011-08-01 DIAGNOSIS — IMO0002 Reserved for concepts with insufficient information to code with codable children: Secondary | ICD-10-CM

## 2011-08-01 DIAGNOSIS — Z1389 Encounter for screening for other disorder: Secondary | ICD-10-CM | POA: Insufficient documentation

## 2011-08-01 DIAGNOSIS — O0930 Supervision of pregnancy with insufficient antenatal care, unspecified trimester: Secondary | ICD-10-CM | POA: Insufficient documentation

## 2011-09-12 ENCOUNTER — Ambulatory Visit (HOSPITAL_COMMUNITY)
Admission: RE | Admit: 2011-09-12 | Discharge: 2011-09-12 | Disposition: A | Discharge status: Home / Self Care | Payer: Self-pay | Source: Ambulatory Visit | Attending: Obstetrics | Admitting: Obstetrics

## 2011-09-12 VITALS — BP 106/66 | HR 77 | Wt 128.5 lb

## 2011-09-12 DIAGNOSIS — Z3689 Encounter for other specified antenatal screening: Secondary | ICD-10-CM | POA: Insufficient documentation

## 2011-09-12 DIAGNOSIS — Z8751 Personal history of pre-term labor: Secondary | ICD-10-CM | POA: Insufficient documentation

## 2011-09-12 DIAGNOSIS — O0930 Supervision of pregnancy with insufficient antenatal care, unspecified trimester: Secondary | ICD-10-CM | POA: Insufficient documentation

## 2011-09-12 DIAGNOSIS — O09299 Supervision of pregnancy with other poor reproductive or obstetric history, unspecified trimester: Secondary | ICD-10-CM | POA: Insufficient documentation

## 2011-09-12 DIAGNOSIS — IMO0002 Reserved for concepts with insufficient information to code with codable children: Secondary | ICD-10-CM

## 2011-10-05 LAB — OB RESULTS CONSOLE GBS: GBS: POSITIVE

## 2011-10-09 LAB — OB RESULTS CONSOLE HEPATITIS B SURFACE ANTIGEN: Hepatitis B Surface Ag: NEGATIVE

## 2011-10-09 LAB — OB RESULTS CONSOLE GC/CHLAMYDIA
Chlamydia: NEGATIVE
Gonorrhea: NEGATIVE

## 2011-10-09 LAB — OB RESULTS CONSOLE RPR: RPR: NONREACTIVE

## 2011-10-10 ENCOUNTER — Ambulatory Visit (HOSPITAL_COMMUNITY)
Admission: RE | Admit: 2011-10-10 | Discharge: 2011-10-10 | Disposition: A | Discharge status: Home / Self Care | Payer: Self-pay | Source: Ambulatory Visit | Attending: Obstetrics | Admitting: Obstetrics

## 2011-10-10 DIAGNOSIS — Z3689 Encounter for other specified antenatal screening: Secondary | ICD-10-CM | POA: Insufficient documentation

## 2011-10-10 DIAGNOSIS — O0930 Supervision of pregnancy with insufficient antenatal care, unspecified trimester: Secondary | ICD-10-CM | POA: Insufficient documentation

## 2011-10-10 DIAGNOSIS — O09299 Supervision of pregnancy with other poor reproductive or obstetric history, unspecified trimester: Secondary | ICD-10-CM | POA: Insufficient documentation

## 2011-10-10 DIAGNOSIS — IMO0002 Reserved for concepts with insufficient information to code with codable children: Secondary | ICD-10-CM

## 2011-10-10 DIAGNOSIS — Z8751 Personal history of pre-term labor: Secondary | ICD-10-CM | POA: Insufficient documentation

## 2011-10-26 ENCOUNTER — Inpatient Hospital Stay (HOSPITAL_COMMUNITY)
Admission: AD | Admit: 2011-10-26 | Discharge: 2011-10-26 | Disposition: A | Discharge status: Home / Self Care | Payer: Self-pay | Source: Ambulatory Visit | Attending: Obstetrics | Admitting: Obstetrics

## 2011-10-26 ENCOUNTER — Encounter (HOSPITAL_COMMUNITY): Payer: Self-pay

## 2011-10-26 DIAGNOSIS — M545 Low back pain, unspecified: Secondary | ICD-10-CM | POA: Insufficient documentation

## 2011-10-26 DIAGNOSIS — O99891 Other specified diseases and conditions complicating pregnancy: Secondary | ICD-10-CM | POA: Insufficient documentation

## 2011-10-26 DIAGNOSIS — N949 Unspecified condition associated with female genital organs and menstrual cycle: Secondary | ICD-10-CM | POA: Insufficient documentation

## 2011-10-26 NOTE — Progress Notes (Signed)
Dr marshall notified of patient, tracing, ctx pattern, sve result. Order to discharge home. 

## 2011-10-26 NOTE — MAU Note (Signed)
Pt states, " I've had a backache and more pressure since last night."

## 2011-10-26 NOTE — MAU Note (Signed)
Patient is in with c/o lower back pain, vaginal pain, groin pain, pelvic pressure that radiates to her anterior thighs which started 2 days ago. She states that she have good fetal movement, denies any vaginal bleeding or lof.

## 2011-11-03 ENCOUNTER — Inpatient Hospital Stay (HOSPITAL_COMMUNITY)
Admission: AD | Admit: 2011-11-03 | Discharge: 2011-11-03 | Disposition: A | Discharge status: Home / Self Care | Payer: Self-pay | Source: Ambulatory Visit | Attending: Obstetrics | Admitting: Obstetrics

## 2011-11-03 ENCOUNTER — Encounter (HOSPITAL_COMMUNITY): Payer: Self-pay | Admitting: *Deleted

## 2011-11-03 DIAGNOSIS — O479 False labor, unspecified: Secondary | ICD-10-CM | POA: Insufficient documentation

## 2011-11-03 NOTE — Progress Notes (Signed)
D/c home

## 2011-11-03 NOTE — MAU Note (Signed)
Patient reports having contractions since yesterday, denies bleeding, having mucus discharge, contractions are every 15 minutes

## 2011-11-04 ENCOUNTER — Encounter (HOSPITAL_COMMUNITY): Payer: Self-pay | Admitting: *Deleted

## 2011-11-04 ENCOUNTER — Encounter (HOSPITAL_COMMUNITY): Payer: Self-pay | Admitting: Anesthesiology

## 2011-11-04 ENCOUNTER — Inpatient Hospital Stay (HOSPITAL_COMMUNITY)
Admission: AD | Admit: 2011-11-04 | Discharge: 2011-11-06 | DRG: 775 | Disposition: A | Discharge status: Home / Self Care | Payer: Medicaid Other | Source: Ambulatory Visit | Attending: Obstetrics | Admitting: Obstetrics

## 2011-11-04 ENCOUNTER — Inpatient Hospital Stay (HOSPITAL_COMMUNITY): Payer: Medicaid Other | Admitting: Anesthesiology

## 2011-11-04 DIAGNOSIS — Z22330 Carrier of Group B streptococcus: Secondary | ICD-10-CM

## 2011-11-04 DIAGNOSIS — J309 Allergic rhinitis, unspecified: Secondary | ICD-10-CM

## 2011-11-04 DIAGNOSIS — J9801 Acute bronchospasm: Secondary | ICD-10-CM

## 2011-11-04 DIAGNOSIS — O99892 Other specified diseases and conditions complicating childbirth: Secondary | ICD-10-CM | POA: Diagnosis present

## 2011-11-04 DIAGNOSIS — IMO0001 Reserved for inherently not codable concepts without codable children: Secondary | ICD-10-CM

## 2011-11-04 DIAGNOSIS — R5381 Other malaise: Secondary | ICD-10-CM

## 2011-11-04 DIAGNOSIS — E079 Disorder of thyroid, unspecified: Secondary | ICD-10-CM

## 2011-11-04 LAB — CBC
HCT: 36.9 % (ref 36.0–46.0)
Hemoglobin: 12.8 g/dL (ref 12.0–15.0)
MCV: 90.2 fL (ref 78.0–100.0)
RBC: 4.09 MIL/uL (ref 3.87–5.11)
WBC: 8 10*3/uL (ref 4.0–10.5)

## 2011-11-04 LAB — ABO/RH: ABO/RH(D): O POS

## 2011-11-04 LAB — TYPE AND SCREEN: Antibody Screen: NEGATIVE

## 2011-11-04 MED ORDER — SENNOSIDES-DOCUSATE SODIUM 8.6-50 MG PO TABS
2.0000 | ORAL_TABLET | Freq: Every day | ORAL | Status: DC
  Administered 2011-11-05: 2 via ORAL

## 2011-11-04 MED ORDER — PENICILLIN G POTASSIUM 5000000 UNITS IJ SOLR
5.0000 10*6.[IU] | Freq: Once | INTRAVENOUS | Status: DC
  Filled 2011-11-04: qty 5

## 2011-11-04 MED ORDER — PHENYLEPHRINE 40 MCG/ML (10ML) SYRINGE FOR IV PUSH (FOR BLOOD PRESSURE SUPPORT)
80.0000 ug | PREFILLED_SYRINGE | INTRAVENOUS | Status: DC | PRN
  Filled 2011-11-04: qty 2

## 2011-11-04 MED ORDER — DIBUCAINE 1 % RE OINT: 1.0000 "application " | TOPICAL_OINTMENT | RECTAL | Status: DC | PRN

## 2011-11-04 MED ORDER — CITRIC ACID-SODIUM CITRATE 334-500 MG/5ML PO SOLN: 30.0000 mL | ORAL | Status: DC | PRN

## 2011-11-04 MED ORDER — EPHEDRINE 5 MG/ML INJ
10.0000 mg | INTRAVENOUS | Status: DC | PRN
  Filled 2011-11-04: qty 2

## 2011-11-04 MED ORDER — OXYCODONE-ACETAMINOPHEN 5-325 MG PO TABS: 1.0000 | ORAL_TABLET | ORAL | Status: DC | PRN

## 2011-11-04 MED ORDER — MAGNESIUM HYDROXIDE 400 MG/5ML PO SUSP: 30.0000 mL | ORAL | Status: DC | PRN

## 2011-11-04 MED ORDER — WITCH HAZEL-GLYCERIN EX PADS: 1.0000 "application " | MEDICATED_PAD | CUTANEOUS | Status: DC | PRN

## 2011-11-04 MED ORDER — IBUPROFEN 600 MG PO TABS
600.0000 mg | ORAL_TABLET | Freq: Four times a day (QID) | ORAL | Status: DC | PRN
  Administered 2011-11-04: 600 mg via ORAL
  Filled 2011-11-04: qty 1

## 2011-11-04 MED ORDER — LIDOCAINE HCL (PF) 1 % IJ SOLN
INTRAMUSCULAR | Status: DC | PRN
  Administered 2011-11-04 (×2): 8 mL

## 2011-11-04 MED ORDER — FENTANYL 2.5 MCG/ML BUPIVACAINE 1/10 % EPIDURAL INFUSION (WH - ANES)
INTRAMUSCULAR | Status: DC | PRN
  Administered 2011-11-04: 14 mL/h via EPIDURAL

## 2011-11-04 MED ORDER — DIPHENHYDRAMINE HCL 25 MG PO CAPS: 25.0000 mg | ORAL_CAPSULE | Freq: Four times a day (QID) | ORAL | Status: DC | PRN

## 2011-11-04 MED ORDER — LACTATED RINGERS IV SOLN
500.0000 mL | Freq: Once | INTRAVENOUS | Status: AC
  Administered 2011-11-04: 500 mL via INTRAVENOUS

## 2011-11-04 MED ORDER — ZOLPIDEM TARTRATE 5 MG PO TABS: 5.0000 mg | ORAL_TABLET | Freq: Every evening | ORAL | Status: DC | PRN

## 2011-11-04 MED ORDER — FENTANYL 2.5 MCG/ML BUPIVACAINE 1/10 % EPIDURAL INFUSION (WH - ANES)
14.0000 mL/h | INTRAMUSCULAR | Status: DC
  Administered 2011-11-04: 14 mL/h via EPIDURAL
  Filled 2011-11-04 (×2): qty 60

## 2011-11-04 MED ORDER — SODIUM CHLORIDE 0.9 % IV SOLN
2.0000 g | Freq: Once | INTRAVENOUS | Status: AC
  Administered 2011-11-04: 2 g via INTRAVENOUS
  Filled 2011-11-04: qty 2000

## 2011-11-04 MED ORDER — LACTATED RINGERS IV SOLN
500.0000 mL | INTRAVENOUS | Status: DC | PRN
  Administered 2011-11-04: 1000 mL via INTRAVENOUS

## 2011-11-04 MED ORDER — BENZOCAINE-MENTHOL 20-0.5 % EX AERO: 1.0000 "application " | INHALATION_SPRAY | CUTANEOUS | Status: DC | PRN

## 2011-11-04 MED ORDER — PENICILLIN G POTASSIUM 5000000 UNITS IJ SOLR
2.5000 10*6.[IU] | INTRAVENOUS | Status: DC
  Filled 2011-11-04 (×4): qty 2.5

## 2011-11-04 MED ORDER — LANOLIN HYDROUS EX OINT: TOPICAL_OINTMENT | CUTANEOUS | Status: DC | PRN

## 2011-11-04 MED ORDER — LACTATED RINGERS IV SOLN
INTRAVENOUS | Status: DC
  Administered 2011-11-04: 06:00:00 via INTRAVENOUS

## 2011-11-04 MED ORDER — FERROUS SULFATE 325 (65 FE) MG PO TABS
325.0000 mg | ORAL_TABLET | Freq: Two times a day (BID) | ORAL | Status: DC
  Administered 2011-11-04 – 2011-11-06 (×2): 325 mg via ORAL
  Filled 2011-11-04 (×2): qty 1

## 2011-11-04 MED ORDER — IBUPROFEN 600 MG PO TABS
600.0000 mg | ORAL_TABLET | Freq: Four times a day (QID) | ORAL | Status: DC
  Administered 2011-11-04 – 2011-11-06 (×7): 600 mg via ORAL
  Filled 2011-11-04 (×7): qty 1

## 2011-11-04 MED ORDER — DIPHENHYDRAMINE HCL 50 MG/ML IJ SOLN: 12.5000 mg | INTRAMUSCULAR | Status: DC | PRN

## 2011-11-04 MED ORDER — OXYTOCIN 40 UNITS IN LACTATED RINGERS INFUSION - SIMPLE MED
62.5000 mL/h | Freq: Once | INTRAVENOUS | Status: AC
  Administered 2011-11-04: 62.5 mL/h via INTRAVENOUS
  Filled 2011-11-04: qty 1000

## 2011-11-04 MED ORDER — ACETAMINOPHEN 325 MG PO TABS: 650.0000 mg | ORAL_TABLET | ORAL | Status: DC | PRN

## 2011-11-04 MED ORDER — PRENATAL MULTIVITAMIN CH
1.0000 | ORAL_TABLET | Freq: Every day | ORAL | Status: DC
  Administered 2011-11-04 – 2011-11-06 (×2): 1 via ORAL
  Filled 2011-11-04 (×2): qty 1

## 2011-11-04 MED ORDER — ONDANSETRON HCL 4 MG/2ML IJ SOLN: 4.0000 mg | Freq: Four times a day (QID) | INTRAMUSCULAR | Status: DC | PRN

## 2011-11-04 MED ORDER — TETANUS-DIPHTH-ACELL PERTUSSIS 5-2.5-18.5 LF-MCG/0.5 IM SUSP: 0.5000 mL | Freq: Once | INTRAMUSCULAR | Status: DC

## 2011-11-04 MED ORDER — ONDANSETRON HCL 4 MG PO TABS: 4.0000 mg | ORAL_TABLET | ORAL | Status: DC | PRN

## 2011-11-04 MED ORDER — LIDOCAINE HCL (PF) 1 % IJ SOLN
30.0000 mL | INTRAMUSCULAR | Status: AC | PRN
Start: 2011-11-04 — End: 2011-11-06
  Administered 2011-11-04: 30 mL via SUBCUTANEOUS
  Filled 2011-11-04: qty 30

## 2011-11-04 MED ORDER — FLEET ENEMA 7-19 GM/118ML RE ENEM: 1.0000 | ENEMA | RECTAL | Status: DC | PRN

## 2011-11-04 MED ORDER — EPHEDRINE 5 MG/ML INJ
10.0000 mg | INTRAVENOUS | Status: DC | PRN
  Filled 2011-11-04: qty 2
  Filled 2011-11-04: qty 4

## 2011-11-04 MED ORDER — MEDROXYPROGESTERONE ACETATE 150 MG/ML IM SUSP: 150.0000 mg | INTRAMUSCULAR | Status: DC | PRN

## 2011-11-04 MED ORDER — PHENYLEPHRINE 40 MCG/ML (10ML) SYRINGE FOR IV PUSH (FOR BLOOD PRESSURE SUPPORT)
80.0000 ug | PREFILLED_SYRINGE | INTRAVENOUS | Status: DC | PRN
  Filled 2011-11-04: qty 5
  Filled 2011-11-04: qty 2

## 2011-11-04 MED ORDER — OXYTOCIN BOLUS FROM INFUSION
250.0000 mL | Freq: Once | INTRAVENOUS | Status: AC
  Administered 2011-11-04: 250 mL via INTRAVENOUS
  Filled 2011-11-04: qty 500

## 2011-11-04 MED ORDER — MEASLES, MUMPS & RUBELLA VAC ~~LOC~~ INJ
0.5000 mL | INJECTION | Freq: Once | SUBCUTANEOUS | Status: DC
  Filled 2011-11-04: qty 0.5

## 2011-11-04 MED ORDER — ONDANSETRON HCL 4 MG/2ML IJ SOLN: 4.0000 mg | INTRAMUSCULAR | Status: DC | PRN

## 2011-11-04 NOTE — H&P (Signed)
Diana Carr is a 26 y.o. female presenting for contractions. Maternal Medical History:  Reason for admission: Reason for admission: contractions.  Contractions: Onset was 6-12 hours ago.   Frequency: regular.    Fetal activity: Perceived fetal activity is normal.    Prenatal complications: no prenatal complications   OB History    Grav Para Term Preterm Abortions TAB SAB Ect Mult Living   2 1  1      1      History reviewed. No pertinent past medical history. History reviewed. No pertinent past surgical history. Family History: family history is not on file. Social History:  reports that she has never smoked. She does not have any smokeless tobacco history on file. She reports that she does not drink alcohol or use illicit drugs.     Review of Systems  Constitutional: Negative for fever.  Eyes: Negative for blurred vision.  Respiratory: Negative for shortness of breath.   Gastrointestinal: Negative for vomiting.  Skin: Negative for rash.  Neurological: Negative for headaches.    Dilation: 5.5 Effacement (%): 80 Station: -2 Exam by:: E Bray RNC Blood pressure 143/85, pulse 75, temperature 99.2 F (37.3 C), temperature source Oral, resp. rate 18, height 5\' 1"  (1.549 m), weight 61.803 kg (136 lb 4 oz), last menstrual period 01/27/2011, SpO2 96.00%. Maternal Exam:  Uterine Assessment: Contraction duration is 3 minutes. Contraction frequency is regular.   Abdomen: Fetal presentation: vertex  Introitus: not evaluated.   Cervix: Cervix evaluated by digital exam.     Fetal Exam Fetal Monitor Review: Variability: moderate (6-25 bpm).   Pattern: accelerations present and no decelerations.    Fetal State Assessment: Category I - tracings are normal.     Physical Exam  Constitutional: She appears well-developed.  HENT:  Head: Normocephalic.  Neck: Neck supple. No thyromegaly present.  Cardiovascular: Normal rate and regular rhythm.   Respiratory: Breath sounds  normal.  GI: Soft. Bowel sounds are normal.  Skin: No rash noted.    Prenatal labs: ABO, Rh: O/Positive/-- (07/23 0000) Antibody: Negative (07/23 0000) Rubella: Immune (07/23 0000) RPR: Nonreactive (07/23 0000)  HBsAg: Negative (07/23 0000)  HIV: Non-reactive (07/23 0000)  GBS: Positive (07/19 0000)   Assessment/Plan: Primipara at term.  Active labor.  GBS pos.  Admit PCN GBS prophylaxis Expectant management Anticipate NSVD   JACKSON-MOORE,Kristopher Attwood A 11/04/2011, 3:58 AM

## 2011-11-04 NOTE — Anesthesia Procedure Notes (Signed)
Epidural Patient location during procedure: OB Start time: 11/04/2011 2:24 AM End time: 11/04/2011 2:28 AM Reason for block: procedure for pain  Staffing Anesthesiologist: Sandrea Hughs Performed by: anesthesiologist   Preanesthetic Checklist Completed: patient identified, site marked, surgical consent, pre-op evaluation, timeout performed, IV checked, risks and benefits discussed and monitors and equipment checked  Epidural Patient position: sitting Prep: site prepped and draped and DuraPrep Patient monitoring: continuous pulse ox and blood pressure Approach: midline Injection technique: LOR air  Needle:  Needle type: Tuohy  Needle gauge: 17 G Needle length: 9 cm Needle insertion depth: 5 cm cm Catheter type: closed end flexible Catheter size: 19 Gauge Catheter at skin depth: 10 cm Test dose: negative and Other  Assessment Sensory level: T9 Events: blood not aspirated, injection not painful, no injection resistance, negative IV test and no paresthesia

## 2011-11-04 NOTE — Anesthesia Preprocedure Evaluation (Signed)
Anesthesia Evaluation  Patient identified by MRN, date of birth, ID band Patient awake    Reviewed: Allergy & Precautions, H&P , NPO status , Patient's Chart, lab work & pertinent test results  Airway Mallampati: I TM Distance: >3 FB Neck ROM: full    Dental No notable dental hx.    Pulmonary neg pulmonary ROS,  breath sounds clear to auscultation  Pulmonary exam normal       Cardiovascular negative cardio ROS      Neuro/Psych negative neurological ROS  negative psych ROS   GI/Hepatic negative GI ROS, Neg liver ROS,   Endo/Other  negative endocrine ROS  Renal/GU negative Renal ROS  negative genitourinary   Musculoskeletal negative musculoskeletal ROS (+)   Abdominal Normal abdominal exam  (+)   Peds negative pediatric ROS (+)  Hematology negative hematology ROS (+)   Anesthesia Other Findings   Reproductive/Obstetrics (+) Pregnancy                           Anesthesia Physical Anesthesia Plan  ASA: II  Anesthesia Plan: Epidural   Post-op Pain Management:    Induction:   Airway Management Planned:   Additional Equipment:   Intra-op Plan:   Post-operative Plan:   Informed Consent: I have reviewed the patients History and Physical, chart, labs and discussed the procedure including the risks, benefits and alternatives for the proposed anesthesia with the patient or authorized representative who has indicated his/her understanding and acceptance.     Plan Discussed with:   Anesthesia Plan Comments:         Anesthesia Quick Evaluation  

## 2011-11-04 NOTE — Anesthesia Postprocedure Evaluation (Signed)
Anesthesia Post Note  Patient: Diana Carr  Procedure(s) Performed: * No procedures listed *  Anesthesia type: Epidural  Patient location: Mother/Baby  Post pain: Pain level controlled  Post assessment: Post-op Vital signs reviewed  Last Vitals:  Filed Vitals:   11/04/11 0829  BP: 115/66  Pulse: 78  Temp:   Resp: 20    Post vital signs: Reviewed  Level of consciousness: awake  Complications: No apparent anesthesia complications

## 2011-11-05 NOTE — Progress Notes (Signed)
UR chart review completed.  

## 2011-11-05 NOTE — Progress Notes (Signed)
Patient ID: Diana Carr, female   DOB: 08-Oct-1985, 26 y.o.   MRN: [id number] Postpartum day one Vital signs normal Fundus firm Lochia moderate Legs negative No complaints doing well

## 2011-11-06 NOTE — Discharge Summary (Signed)
Obstetric Discharge Summary Reason for Admission: onset of labor Prenatal Procedures: none Intrapartum Procedures: spontaneous vaginal delivery Postpartum Procedures: none Complications-Operative and Postpartum: none Hemoglobin  Date Value Range Status  11/04/2011 12.8  12.0 - 15.0 g/dL Final     HCT  Date Value Range Status  11/04/2011 36.9  36.0 - 46.0 % Final    Physical Exam:  General: alert Lochia: appropriate Uterine Fundus: firm Incision: healing well DVT Evaluation: No evidence of DVT seen on physical exam.  Discharge Diagnoses: Term Pregnancy-delivered  Discharge Information: Date: 11/06/2011 Activity: pelvic rest Diet: routine Medications: Percocet Condition: stable Instructions: refer to practice specific booklet Discharge to: home Follow-up Information    Follow up with Sherissa Tenenbaum A, MD. Call in 6 weeks.   Contact information:   [address] [place] [address] [phone]          Newborn Data: Live born female  Birth Weight: 6 lb 6.8 oz (2915 g) APGAR: 8, 9  Home with mother.  Donnamaria Shands A 11/06/2011, 7:06 AM

## 2011-11-06 NOTE — Progress Notes (Signed)
Patient ID: Diana Carr, female   DOB: 1985/07/19, 26 y.o.   MRN: [id number] Postpartum day 2 Vital signs normal Fundus firm Lochia moderate Legs negative home today no complaints

## 2013-04-08 IMAGING — US US OB FOLLOW-UP
1 series · 12 of 28 positions shown · non-contrast
Comparison: none

[Series 1: us ob follow-up · 0.23mm/px · 12 of 33 slices shown]
[im 2/33]
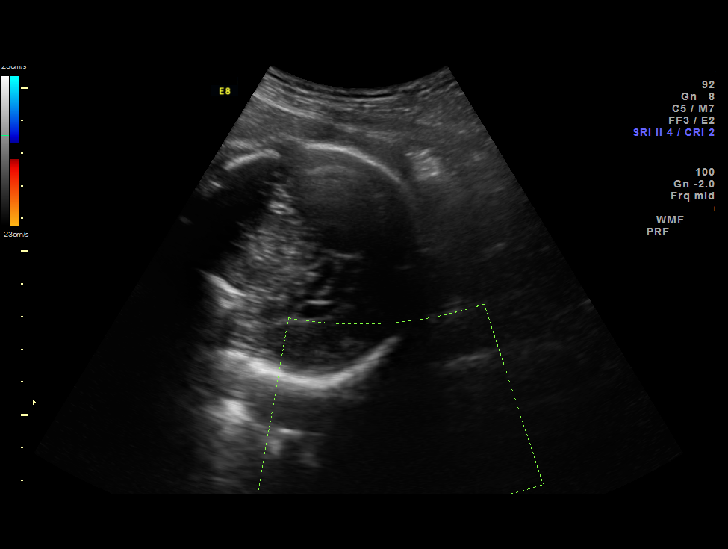
[im 4/33]
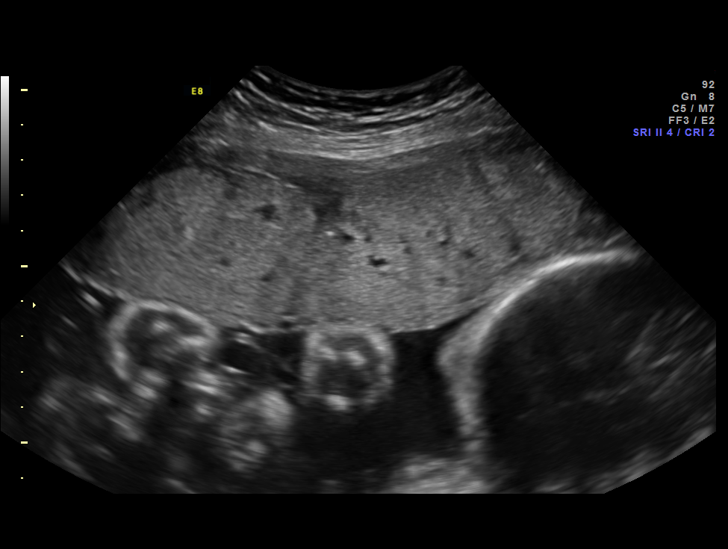
[im 6/33]
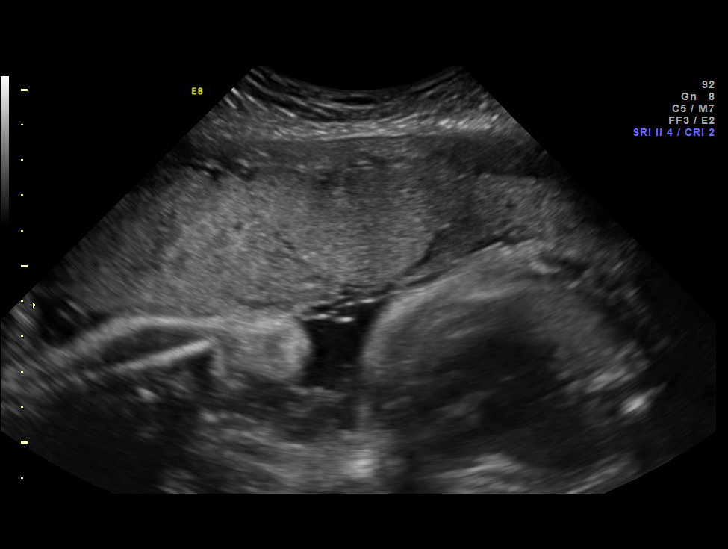
[im 10/33]
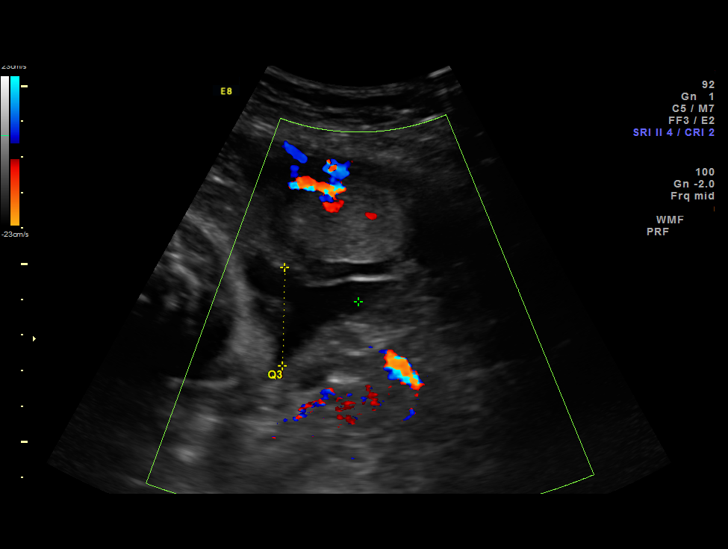
[im 12/33]
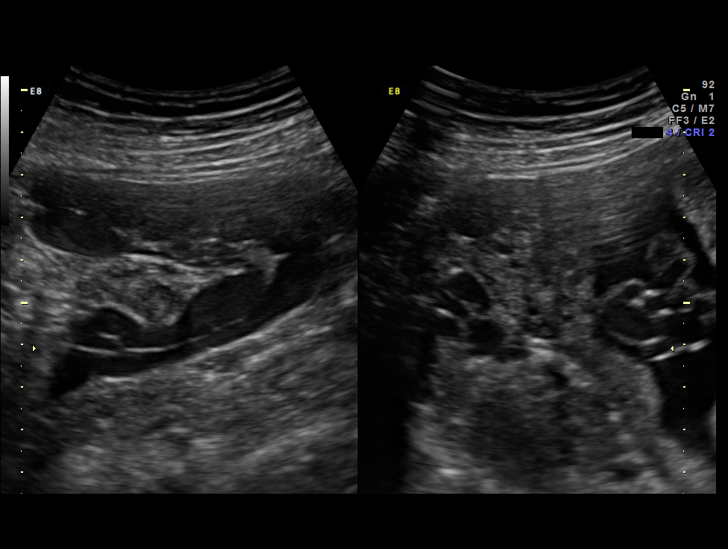
[im 15/33]
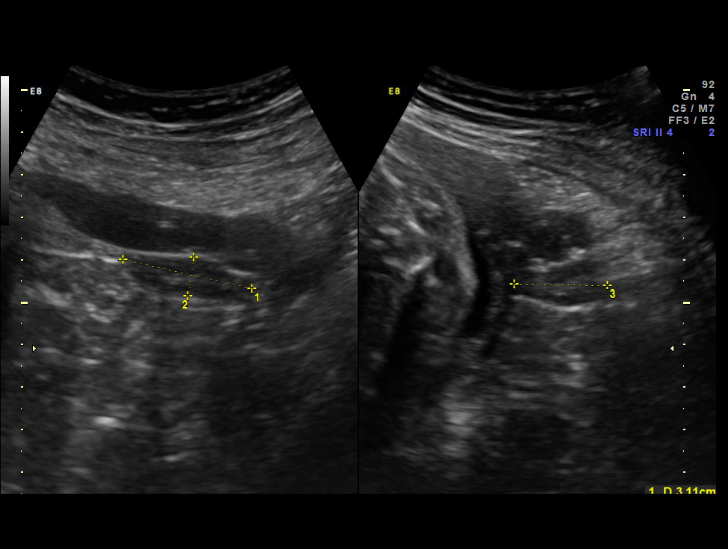
[im 18/33]
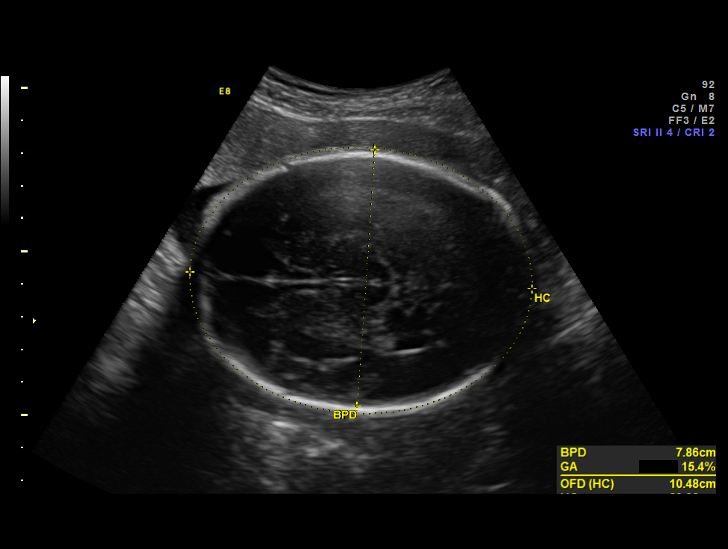
[im 21/33]
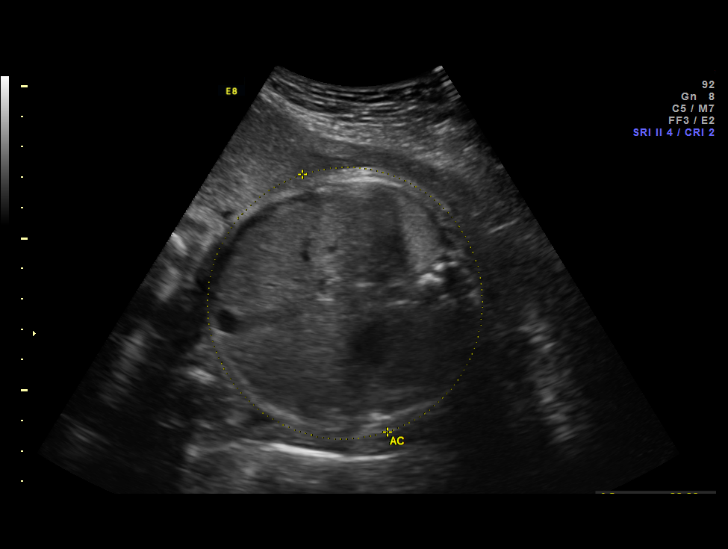
[im 23/33]
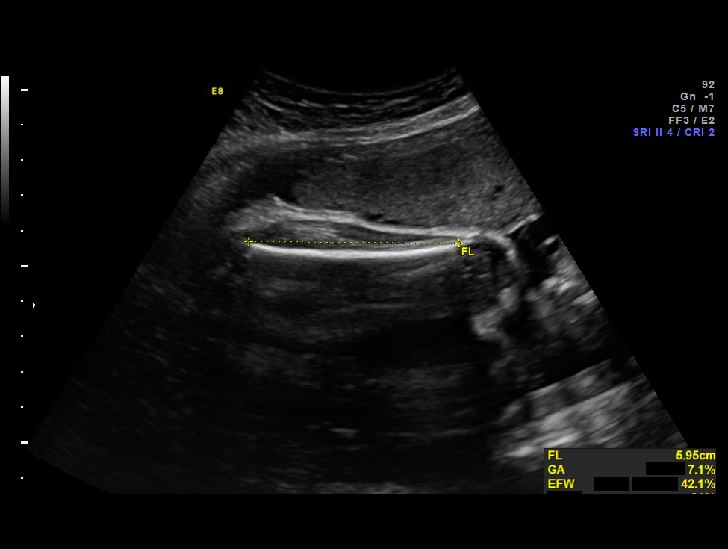
[im 27/33]
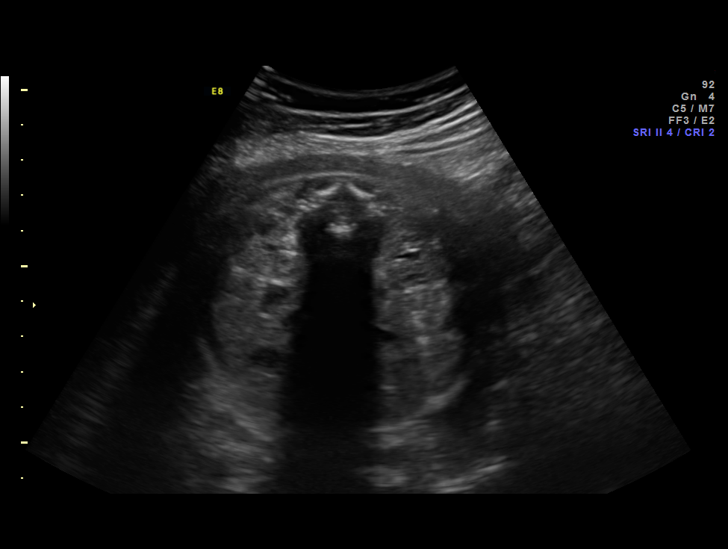
[im 29/33]
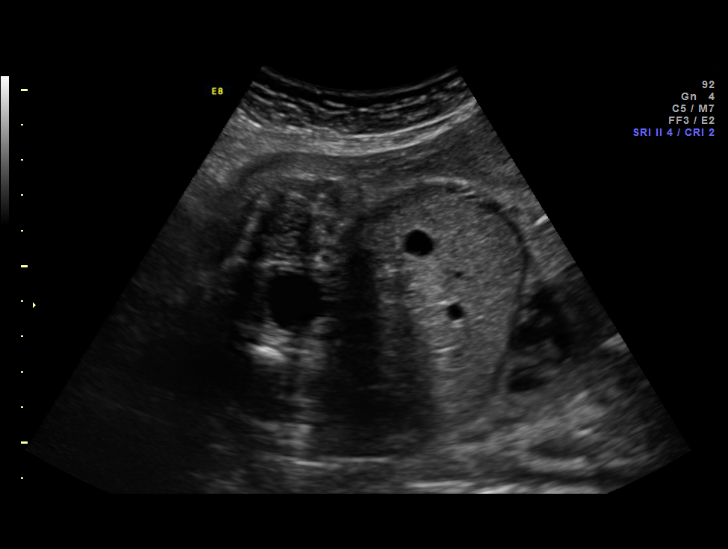
[im 31/33]
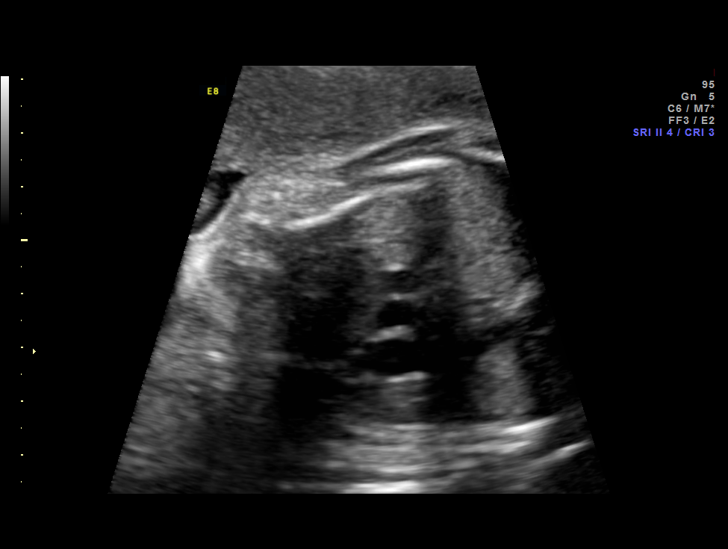

[12 of 28 positions shown; findings below may reference images not displayed]

OBSTETRICS REPORT
                      (Signed Final 09/12/2011 [DATE])

 Order#:         [id number]
Procedures

 US OB FOLLOW UP                                       76816.1
Indications

 Poor obstetric history: Previous fetal growth
 restriction (FGR)
 No or Little Prenatal Care
 Poor obstetric history: Previous preterm delivery
 (36 weeks)
 Assess Fetal Growth / Estimated Fetal Weight
Fetal Evaluation

 Fetal Heart Rate:  136                          bpm
 Cardiac Activity:  Observed
 Presentation:      Cephalic
 Placenta:          Anterior, above cervical os
 P. Cord            Previously Visualized
 Insertion:

 Amniotic Fluid
 AFI FV:      Subjectively within normal limits
 AFI Sum:     13.82   cm       46  %Tile     Larg Pckt:    6.53  cm
 RUQ:   6.53    cm   RLQ:    2.56   cm    LUQ:   2.76    cm   LLQ:    1.97   cm
Biometry

 BPD:     78.9  mm     G. Age:  31w 5d                CI:         75.1   70 - 86
 OFD:    105.1  mm                                    FL/HC:      20.3   19.9 -

 HC:     295.8  mm     G. Age:  32w 5d       18  %    HC/AC:      1.05   0.96 -

 AC:     281.1  mm     G. Age:  32w 1d       38  %    FL/BPD:     75.9   71 - 87
 FL:      59.9  mm     G. Age:  31w 1d        9  %    FL/AC:      21.3   20 - 24

 Est. FW:    5299  gm      4 lb 1 oz     44  %
Gestational Age

 LMP:           32w 4d        Date:  01/27/11                 EDD:   11/03/11
 U/S Today:     31w 6d                                        EDD:   11/08/11
 Best:          32w 4d     Det. By:  LMP  (01/27/11)          EDD:   11/03/11
Anatomy

 Cranium:           Appears normal      Aortic Arch:       Previously seen
 Fetal Cavum:       Previously seen     Ductal Arch:       Previously seen
 Ventricles:        Appears normal      Diaphragm:         Appears normal
 Choroid Plexus:    Previously seen     Stomach:           Appears
                                                           normal, left
                                                           sided
 Cerebellum:        Previously seen     Abdomen:           Previously seen
 Posterior Fossa:   Previously seen     Abdominal Wall:    Previously seen
 Nuchal Fold:       Not applicable      Cord Vessels:      Previously seen
                    (>20 wks GA)
 Face:              Previously seen     Kidneys:           Appear normal
 Heart:             Appears normal      Bladder:           Appears normal
                    (4 chamber &
                    axis)
 RVOT:              Previously seen     Spine:             Previously seen
 LVOT:              Previously seen     Limbs:             Previously seen

 Other:     Heels previously seen.
Cervix Uterus Adnexa

 Cervix:       Not visualized (advanced GA >29 wks)
 Left Ovary:    Within normal limits.
 Right Ovary:   Within normal limits.

 Adnexa:     No abnormality visualized.
Impression

 IUP at 32+4 weeks
 Normal interval anatomy; anatomic survey complete
 Normal amniotic fluid volume
 Appropriate interval growth with EFW at the 44th %tile
Recommendations

 Follow-up ultrasound for growth in 4 weeks

 questions or concerns.

## 2013-05-06 IMAGING — US US OB FOLLOW-UP
1 series · 12 of 28 positions shown · non-contrast
Comparison: none

[Series 1: us ob follow-up · 12 of 33 slices shown]
[im 2/33]
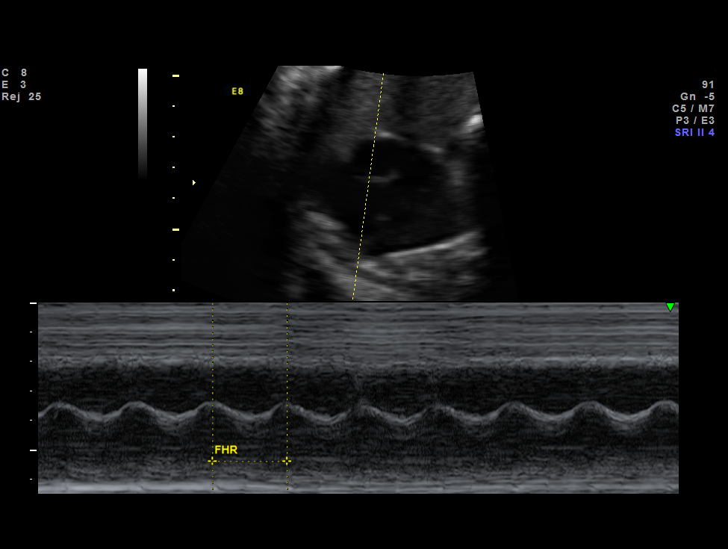
[im 4/33]
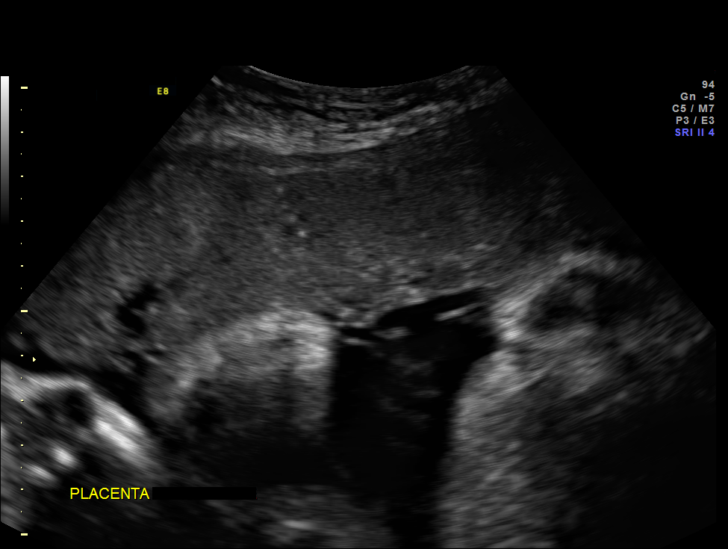
[im 6/33]
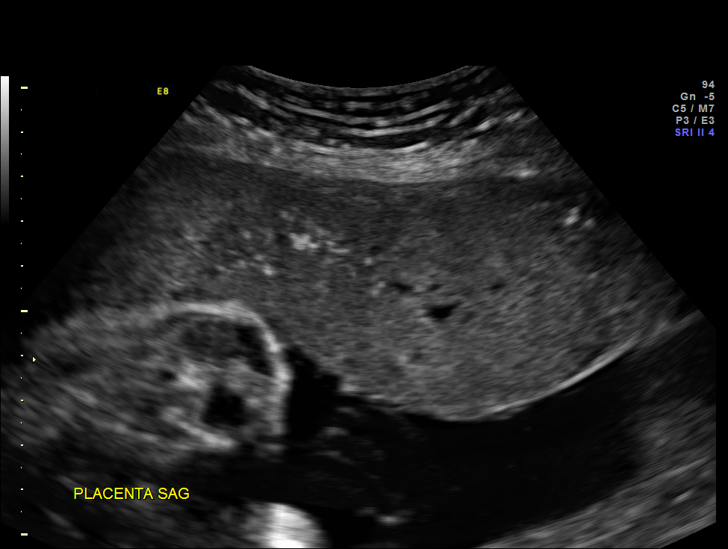
[im 10/33]
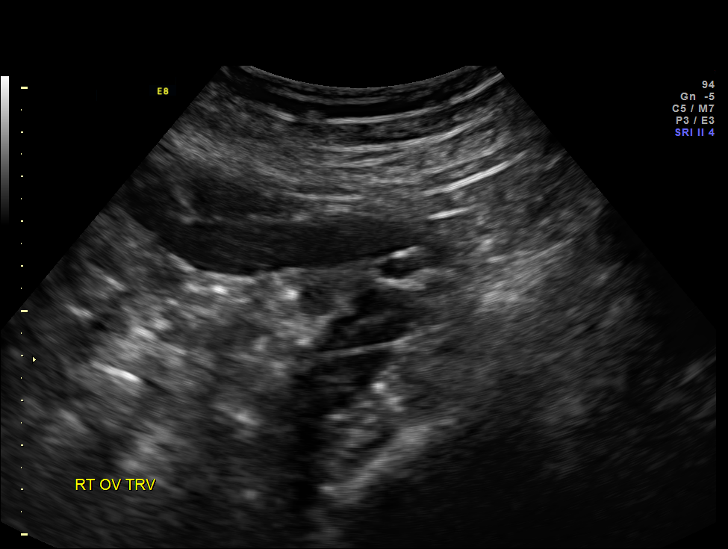
[im 12/33]
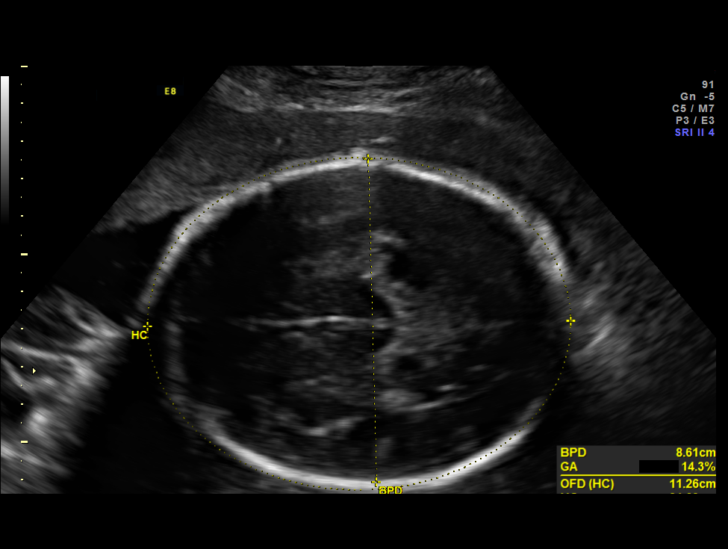
[im 15/33]
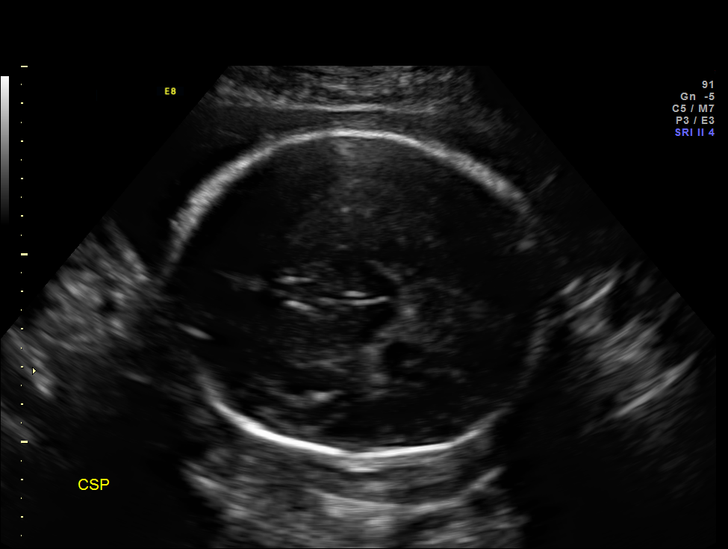
[im 18/33]
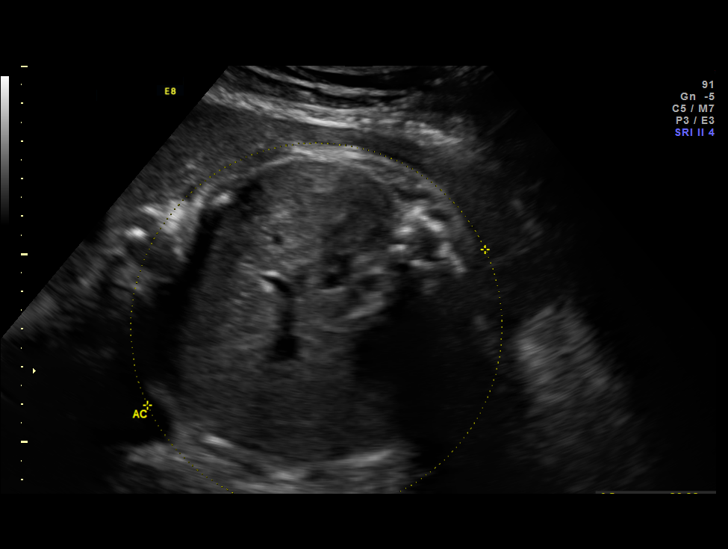
[im 21/33]
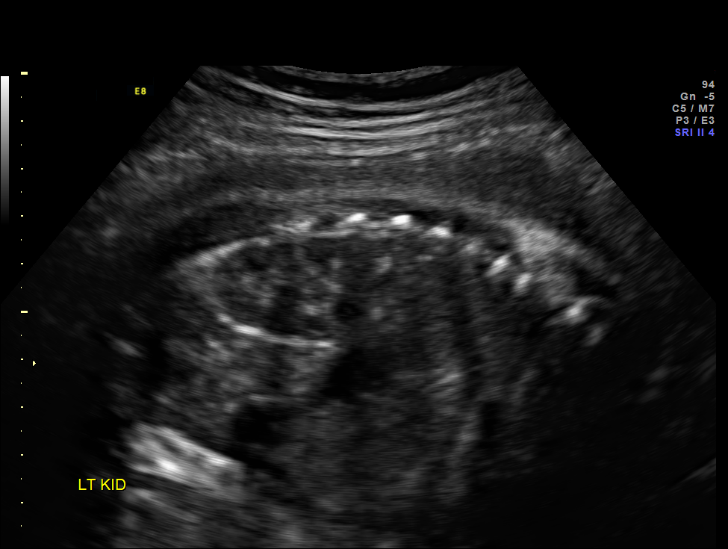
[im 23/33]
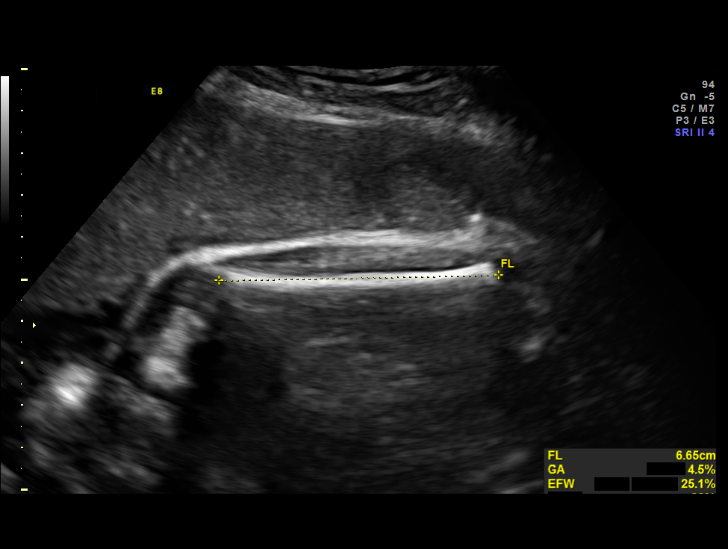
[im 27/33]
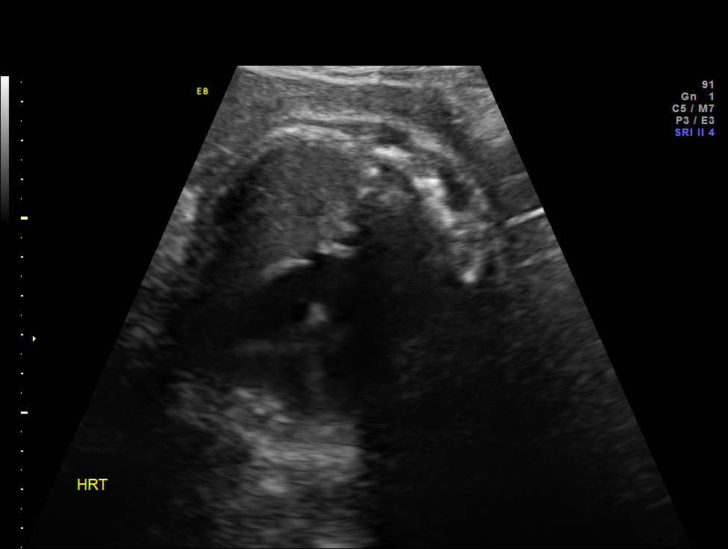
[im 29/33]
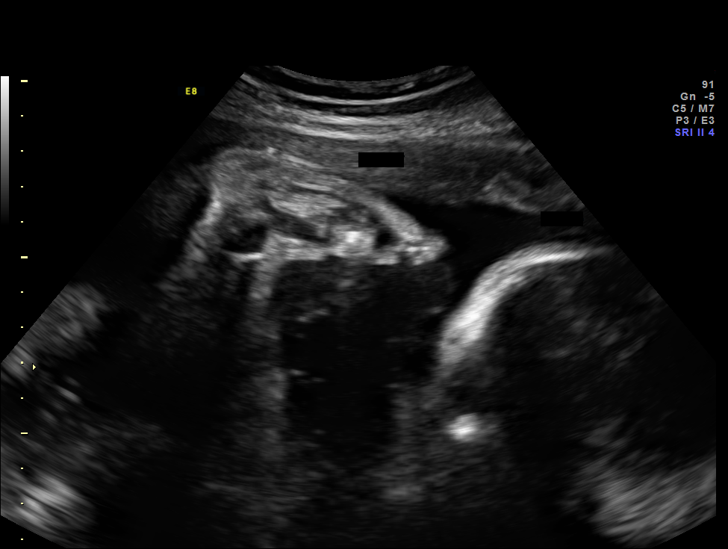
[im 31/33]
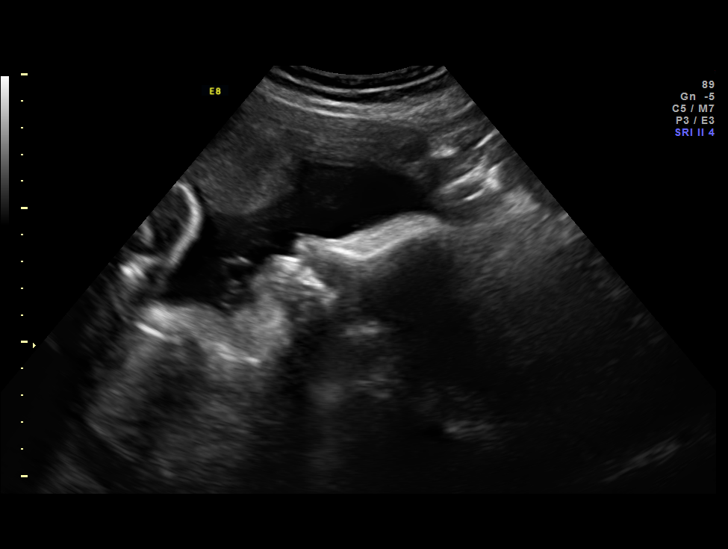

[12 of 28 positions shown; findings below may reference images not displayed]

OBSTETRICS REPORT
                      (Signed Final 10/10/2011 [DATE])

 Order#:         [id number]
Procedures

 US OB FOLLOW UP                                       76816.1
Indications

 No or Little Prenatal Care
 Poor obstetric history: Previous fetal growth
 restriction (FGR)
 Poor obstetric history: Previous preterm delivery
 (36 weeks)
 Assess Fetal Growth / Estimated Fetal Weight
Fetal Evaluation

 Fetal Heart Rate:  135                          bpm
 Cardiac Activity:  Observed
 Presentation:      Cephalic
 Placenta:          Anterior, above cervical os
 P. Cord            Previously Visualized
 Insertion:

 Amniotic Fluid
 AFI FV:      Subjectively within normal limits
                                             Larg Pckt:     6.4  cm
Biometry

 BPD:       86  mm     G. Age:  34w 5d                CI:         75.9   70 - 86
 OFD:    113.3  mm                                    FL/HC:      21.0   20.8 -

 HC:     317.6  mm     G. Age:  35w 5d        9  %    HC/AC:      1.03   0.92 -

 AC:     308.9  mm     G. Age:  34w 6d       17  %    FL/BPD:     77.6   71 - 87
 FL:      66.7  mm     G. Age:  34w 2d        6  %    FL/AC:      21.6   20 - 24
 HUM:       61  mm     G. Age:  35w 2d       44  %

 Est. FW:    0800  gm      5 lb 9 oz     24  %
Gestational Age

 LMP:           36w 4d        Date:  01/27/11                 EDD:   11/03/11
 U/S Today:     34w 6d                                        EDD:   11/15/11
 Best:          36w 4d     Det. By:  LMP  (01/27/11)          EDD:   11/03/11
Anatomy

 Cranium:           Appears normal      Aortic Arch:       Previously seen
 Fetal Cavum:       Appears normal      Ductal Arch:       Previously seen
 Ventricles:        Appears normal      Diaphragm:         Previously seen
 Choroid Plexus:    Previously seen     Stomach:           Appears
                                                           normal, left
                                                           sided
 Cerebellum:        Previously seen     Abdomen:           Previously seen
 Posterior Fossa:   Previously seen     Abdominal Wall:    Previously seen
 Nuchal Fold:       Not applicable      Cord Vessels:      Previously seen
                    (>20 wks GA)
 Face:              Previously seen     Kidneys:           Appear normal
 Heart:             Appears normal      Bladder:           Appears normal
                    (4 chamber &
                    axis)
 RVOT:              Previously seen     Spine:             Previously seen
 LVOT:              Previously seen     Limbs:             Previously seen

 Other:     Heels previously seen.
Cervix Uterus Adnexa

 Cervix:       Not visualized (advanced GA >29 wks)
 Left Ovary:    Within normal limits.
 Right Ovary:   Within normal limits.

 Adnexa:     No abnormality visualized.
Impression

 IUP at 36+4 weeks
 Normal interval anatomy; anatomic survey complete
 Normal amniotic fluid volume
 Appropriate interval growth with EFW at the 24th %tile
 (previous study, EFW was at the 44th %tile)
Recommendations

 Follow-up as clinically indicated

 questions or concerns.

## 2014-01-18 ENCOUNTER — Encounter (HOSPITAL_COMMUNITY): Payer: Self-pay | Admitting: *Deleted

## 2014-11-03 ENCOUNTER — Ambulatory Visit: Payer: Medicaid Other | Attending: Family Medicine

## 2014-12-22 ENCOUNTER — Encounter: Payer: Self-pay | Admitting: Family Medicine

## 2014-12-22 ENCOUNTER — Ambulatory Visit: Payer: Medicaid Other | Attending: Family Medicine | Admitting: Family Medicine

## 2014-12-22 DIAGNOSIS — Z0000 Encounter for general adult medical examination without abnormal findings: Secondary | ICD-10-CM

## 2014-12-22 DIAGNOSIS — L609 Nail disorder, unspecified: Secondary | ICD-10-CM

## 2014-12-22 DIAGNOSIS — Z23 Encounter for immunization: Secondary | ICD-10-CM | POA: Insufficient documentation

## 2014-12-22 DIAGNOSIS — L608 Other nail disorders: Secondary | ICD-10-CM | POA: Insufficient documentation

## 2014-12-22 LAB — CBC
HEMATOCRIT: 38.1 % (ref 36.0–46.0)
HEMOGLOBIN: 12.9 g/dL (ref 12.0–15.0)
MCH: 29.4 pg (ref 26.0–34.0)
MCHC: 33.9 g/dL (ref 30.0–36.0)
MCV: 86.8 fL (ref 78.0–100.0)
MPV: 10.9 fL (ref 8.6–12.4)
Platelets: 298 10*3/uL (ref 150–400)
RBC: 4.39 MIL/uL (ref 3.87–5.11)
RDW: 12.8 % (ref 11.5–15.5)
WBC: 5.1 10*3/uL (ref 4.0–10.5)

## 2014-12-22 LAB — COMPREHENSIVE METABOLIC PANEL
ALBUMIN: 4.3 g/dL (ref 3.6–5.1)
ALK PHOS: 83 U/L (ref 33–115)
ALT: 13 U/L (ref 6–29)
AST: 16 U/L (ref 10–30)
BILIRUBIN TOTAL: 0.3 mg/dL (ref 0.2–1.2)
BUN: 17 mg/dL (ref 7–25)
CALCIUM: 8.8 mg/dL (ref 8.6–10.2)
CO2: 25 mmol/L (ref 20–31)
CREATININE: 0.71 mg/dL (ref 0.50–1.10)
Chloride: 103 mmol/L (ref 98–110)
Glucose, Bld: 99 mg/dL (ref 65–99)
Potassium: 4.3 mmol/L (ref 3.5–5.3)
SODIUM: 138 mmol/L (ref 135–146)
TOTAL PROTEIN: 6.8 g/dL (ref 6.1–8.1)

## 2014-12-22 LAB — TSH: TSH: 0.494 u[IU]/mL (ref 0.350–4.500)

## 2014-12-22 NOTE — Progress Notes (Signed)
Patient here to establish care.  Patient denies any pain at this time.

## 2014-12-22 NOTE — Patient Instructions (Signed)
Fue un [employer].   Estamos haciendo una evaluacion para problemas de la tiroide, anemia, y un perfil metabolico.    Podemos marcarle otra cita para chequear para la posicion del DIU y para ver si necesita del papanicolau.   SCHEDULE FOR FOLLOW UP FOR IUD PLACEMENT CHECK, PAP AS NEEDED.

## 2014-12-22 NOTE — Assessment & Plan Note (Signed)
To recheck TSH today; pitting fingernails over the past six months.

## 2014-12-22 NOTE — Progress Notes (Signed)
   Subjective:    Patient ID: Diana Carr, female    DOB: [birth year], 29 y.o.   MRN: [id number]  HPI Patient is new to practice, visit conducted in [place].   Patient complains of bilateral hands fingernail pitting.  Over the past six months. 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  Routine cleaning supplies in her home.  No other industrial strength cleaning supplies.   Denies fevers, chills, sweats, weight changes.  Has had history of low TSH in 2011, recheck along with free T4 both normal.   No history of liver disease or anemia by report.  Had copper IUD placed 2 years ago, would like check to see if string present. Pap done at that time, reports it was normal  Review of Systems     Objective:   Physical Exam Well appaering, no distress HEENT neck supple, no cervical adenopathy. Moist mucus membranes. Clear oropharynx. No proptosis.  COR Regular S1S2, no extra sounds PULM Clear bilaterally.        Assessment & Plan:

## 2014-12-23 ENCOUNTER — Telehealth: Payer: Self-pay | Admitting: *Deleted

## 2014-12-23 NOTE — Telephone Encounter (Signed)
Medical Assistant used [employer] to contact patient.  Interpreter Name: Hollie Salk Interpreter #: [phone] Patient was not available, [employer] left patient a voicemail. Voicemail states to give a call back to [place] with [facility] at [phone].

## 2014-12-23 NOTE — Telephone Encounter (Signed)
-----   Message from James O Breen, MD sent at 12/23/2014  9:09 AM EDT ----- Please let patient know her thyroid test , metabolic panel and blood count are all normal.  Keep follow up appointment to address nail issue. Thanks, JB 

## 2014-12-28 NOTE — Telephone Encounter (Signed)
-----   Message from James O Breen, MD sent at 12/23/2014  9:09 AM EDT ----- Please let patient know her thyroid test , metabolic panel and blood count are all normal.  Keep follow up appointment to address nail issue. Thanks, JB 

## 2014-12-28 NOTE — Telephone Encounter (Signed)
Medical Assistant used [employer] to contact patient.  Interpreter Name:Omar Interpreter #: [phone] Medical Assistant left message on patient's home and cell voicemail. Voicemail states to give a call back to [place] with [facility] at [phone].

## 2014-12-31 NOTE — Telephone Encounter (Signed)
-----   Message from James O Breen, MD sent at 12/23/2014  9:09 AM EDT ----- Please let patient know her thyroid test , metabolic panel and blood count are all normal.  Keep follow up appointment to address nail issue. Thanks, JB 

## 2014-12-31 NOTE — Telephone Encounter (Signed)
Medical Assistant used [employer] to contact patient.  Interpreter Name: [facility] Interpreter #: [phone]   Spoke with patients sister. Sister provided medical assistant with alternative number for patient who was expecting staff call. [phone]- patient did not answer.  Medical Assistant left message on patient's home and cell voicemail. Voicemail states to give a call back to [place] [facility] at [phone].

## 2014-12-31 NOTE — Telephone Encounter (Signed)
-----   Message from Barbaraann BarthelJames O Breen, MD sent at 12/23/2014  9:09 AM EDT ----- Please let patient know her thyroid test , metabolic panel and blood count are all normal.  Keep follow up appointment to address nail issue. Thanks, JB

## 2014-12-31 NOTE — Telephone Encounter (Signed)
Medical Assistant used [employer] to contact patient.  Interpreter Name: Justice RocherMichelle Interpreter #: [phone]  Patient verified DOB Patient made aware of her thyroid test, metabolic panel and blood count results all being normal. Patient informed of keeping her follow-up appointment to address her nail issue. Patient had no further questions.

## 2015-04-07 ENCOUNTER — Encounter: Payer: Self-pay | Admitting: Internal Medicine

## 2015-04-07 ENCOUNTER — Ambulatory Visit: Payer: Self-pay | Attending: Internal Medicine | Admitting: Internal Medicine

## 2015-04-07 VITALS — BP 100/68 | HR 52 | Temp 98.0°F | Resp 17 | Ht 61.0 in | Wt 106.0 lb

## 2015-04-07 DIAGNOSIS — B351 Tinea unguium: Secondary | ICD-10-CM | POA: Insufficient documentation

## 2015-04-07 DIAGNOSIS — R51 Headache: Secondary | ICD-10-CM | POA: Insufficient documentation

## 2015-04-07 DIAGNOSIS — M26609 Unspecified temporomandibular joint disorder, unspecified side: Secondary | ICD-10-CM | POA: Insufficient documentation

## 2015-04-07 DIAGNOSIS — M26629 Arthralgia of temporomandibular joint, unspecified side: Secondary | ICD-10-CM

## 2015-04-07 MED ORDER — TERBINAFINE HCL 250 MG PO TABS: 250.0000 mg | ORAL_TABLET | Freq: Every day | ORAL | Status: DC

## 2015-04-07 MED ORDER — NAPROXEN 500 MG PO TABS: 500.0000 mg | ORAL_TABLET | Freq: Two times a day (BID) | ORAL | Status: DC

## 2015-04-07 NOTE — Progress Notes (Signed)
Patient here to establish care Complains of having a fungus to her ring finger on her right hand for the  Past eight months Also complains of having headaches on and off Information via interpreter Kendall ID# [id number]

## 2015-04-07 NOTE — Progress Notes (Signed)
Patient ID: Diana Carr, female   DOB: 10/24/1985, 30 y.o.   MRN: [id number]  CC: nail fungus  HPI: Diana Carr is a 30 y.o. female here today for a follow up visit.  Patient has no past medical history. Patient reports that she was seen here 3 months ago for the same complaint of fingernail fungus. She noticed right ring finger nail pitting for the past 1 year and has not tried using anything. The finger is not red or painful.  Patient would also like evaluation of intermittent headaches. She reports that when she awakens in the morning she has pain in her jaw and temples that feel like she has been grinding her teeth in her sleep. She is not aware of any grinding. She has tightness in the jaw and pain is aggravated by opening her mouth too wide or trying to close her mouth fully.   No Known Allergies History reviewed. No pertinent past medical history. No current outpatient prescriptions on file prior to visit.   No current facility-administered medications on file prior to visit.   History reviewed. No pertinent family history. Social History   Social History  . Marital Status: Single    Spouse Name: N/A  . Number of Children: N/A  . Years of Education: N/A   Occupational History  . Not on file.   Social History Main Topics  . Smoking status: Never Smoker   . Smokeless tobacco: Not on file  . Alcohol Use: No  . Drug Use: No  . Sexual Activity: Yes    Birth Control/ Protection: None   Other Topics Concern  . Not on file   Social History Narrative    Review of Systems: Other than what is stated in HPI, all other systems are negative.   Objective:   Filed Vitals:   04/07/15 1028  BP: 100/68  Pulse: 52  Temp: 98 F (36.7 C)  Resp: 17    Physical Exam  Constitutional: She is oriented to person, place, and time.  Cardiovascular: Normal rate, regular rhythm and normal heart sounds.   Pulmonary/Chest: Effort normal and breath sounds normal.  Musculoskeletal:  She exhibits tenderness (TMJ).  Neurological: She is alert and oriented to person, place, and time. No cranial nerve deficit.  Skin: Skin is warm and dry.  Right ring finger nail pitting   Psychiatric: She has a normal mood and affect.     Lab Results  Component Value Date   WBC 5.1 12/22/2014   HGB 12.9 12/22/2014   HCT 38.1 12/22/2014   MCV 86.8 12/22/2014   PLT 298 12/22/2014   Lab Results  Component Value Date   CREATININE 0.71 12/22/2014   BUN 17 12/22/2014   NA 138 12/22/2014   K 4.3 12/22/2014   CL 103 12/22/2014   CO2 25 12/22/2014    No results found for: HGBA1C Lipid Panel     Component Value Date/Time   CHOL 132 09/16/2007 2207   TRIG 41 09/16/2007 2207   HDL 60 09/16/2007 2207   CHOLHDL 2.2 Ratio 09/16/2007 2207   VLDL 8 09/16/2007 2207   LDLCALC 64 09/16/2007 2207       Assessment and plan:   Diana Carr was seen today for nail problem.  Diagnoses and all orders for this visit:  Nail fungus -     terbinafine (LAMISIL) 250 MG tablet; Take 1 tablet (250 mg total) by mouth daily. Nail fungus -     COMPLETE METABOLIC PANEL WITH GFR; Future Will  take Lamisil for 1 month. She agrees to quit breastfeeding. She will return in 1 month to check liver function.   TMJ syndrome -     naproxen (NAPROSYN) 500 MG tablet; Take 1 tablet (500 mg total) by mouth 2 (two) times daily with a meal. Headache/TMJ TMJ precautions addressed. Will try Naproxen for pain and inflammation  Due to language barrier, an interpreter was present during the history-taking and subsequent discussion (and for part of the physical exam) with this patient.   Return in about 1 month (around 05/08/2015) for Lab Visit.       Ambrose Finland, NP-C [facility] [phone] 04/07/2015, 10:38 AM

## 2015-05-06 ENCOUNTER — Other Ambulatory Visit: Payer: Self-pay

## 2018-09-17 ENCOUNTER — Other Ambulatory Visit: Payer: Self-pay

## 2018-09-17 DIAGNOSIS — Z20822 Contact with and (suspected) exposure to covid-19: Secondary | ICD-10-CM

## 2018-09-23 LAB — NOVEL CORONAVIRUS, NAA: SARS-CoV-2, NAA: NOT DETECTED

## 2018-09-23 LAB — SPECIMEN STATUS REPORT

## 2021-09-14 ENCOUNTER — Emergency Department (HOSPITAL_COMMUNITY): Payer: Self-pay

## 2021-09-14 ENCOUNTER — Emergency Department (HOSPITAL_COMMUNITY)
Admission: EM | Admit: 2021-09-14 | Discharge: 2021-09-14 | Disposition: A | Discharge status: Home / Self Care | Payer: Self-pay | Attending: Student | Admitting: Student

## 2021-09-14 ENCOUNTER — Other Ambulatory Visit: Payer: Self-pay

## 2021-09-14 ENCOUNTER — Encounter (HOSPITAL_COMMUNITY): Payer: Self-pay | Admitting: *Deleted

## 2021-09-14 DIAGNOSIS — M25511 Pain in right shoulder: Secondary | ICD-10-CM | POA: Insufficient documentation

## 2021-09-14 DIAGNOSIS — R519 Headache, unspecified: Secondary | ICD-10-CM | POA: Diagnosis present

## 2021-09-14 DIAGNOSIS — Y92410 Unspecified street and highway as the place of occurrence of the external cause: Secondary | ICD-10-CM | POA: Diagnosis not present

## 2021-09-14 DIAGNOSIS — M25512 Pain in left shoulder: Secondary | ICD-10-CM | POA: Diagnosis not present

## 2021-09-14 DIAGNOSIS — M542 Cervicalgia: Secondary | ICD-10-CM | POA: Insufficient documentation

## 2021-09-14 LAB — PREGNANCY, URINE: Preg Test, Ur: NEGATIVE

## 2021-09-14 MED ORDER — LACTATED RINGERS IV BOLUS
1000.0000 mL | Freq: Once | INTRAVENOUS | Status: AC
  Administered 2021-09-14: 1000 mL via INTRAVENOUS

## 2021-09-14 MED ORDER — KETOROLAC TROMETHAMINE 15 MG/ML IJ SOLN
15.0000 mg | Freq: Once | INTRAMUSCULAR | Status: AC
Start: 2021-09-14 — End: 2021-09-14
  Administered 2021-09-14: 15 mg via INTRAVENOUS
  Filled 2021-09-14: qty 1

## 2021-09-14 MED ORDER — LIDOCAINE 5 % EX PTCH: 1.0000 | MEDICATED_PATCH | CUTANEOUS | 0 refills | Status: AC

## 2021-09-14 MED ORDER — LIDOCAINE 5 % EX PTCH
2.0000 | MEDICATED_PATCH | CUTANEOUS | Status: DC
  Administered 2021-09-14: 2 via TRANSDERMAL
  Filled 2021-09-14: qty 2

## 2021-09-14 MED ORDER — PROCHLORPERAZINE EDISYLATE 10 MG/2ML IJ SOLN
10.0000 mg | Freq: Once | INTRAMUSCULAR | Status: AC
  Administered 2021-09-14: 10 mg via INTRAVENOUS
  Filled 2021-09-14: qty 2

## 2021-09-14 MED ORDER — NAPROXEN 375 MG PO TABS: 375.0000 mg | ORAL_TABLET | Freq: Two times a day (BID) | ORAL | 0 refills | Status: DC
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
## 2021-09-25 ENCOUNTER — Emergency Department (HOSPITAL_COMMUNITY): Payer: Self-pay

## 2021-09-25 ENCOUNTER — Inpatient Hospital Stay (HOSPITAL_COMMUNITY)
Admission: EM | Admit: 2021-09-25 | Discharge: 2021-09-27 | DRG: 759 | Disposition: A | Discharge status: Home / Self Care | Payer: Self-pay | Attending: Obstetrics and Gynecology | Admitting: Obstetrics and Gynecology

## 2021-09-25 ENCOUNTER — Encounter (HOSPITAL_COMMUNITY): Payer: Self-pay | Admitting: Emergency Medicine

## 2021-09-25 ENCOUNTER — Other Ambulatory Visit: Payer: Self-pay

## 2021-09-25 DIAGNOSIS — Z7982 Long term (current) use of aspirin: Secondary | ICD-10-CM

## 2021-09-25 DIAGNOSIS — Z975 Presence of (intrauterine) contraceptive device: Secondary | ICD-10-CM

## 2021-09-25 DIAGNOSIS — N7093 Salpingitis and oophoritis, unspecified: Principal | ICD-10-CM | POA: Diagnosis present

## 2021-09-25 DIAGNOSIS — Z20822 Contact with and (suspected) exposure to covid-19: Secondary | ICD-10-CM | POA: Diagnosis present

## 2021-09-25 HISTORY — DX: Other specified health status: Z78.9

## 2021-09-25 LAB — URINALYSIS, ROUTINE W REFLEX MICROSCOPIC
Bacteria, UA: NONE SEEN
Bilirubin Urine: NEGATIVE
Glucose, UA: NEGATIVE mg/dL
Ketones, ur: 80 mg/dL — AB
Nitrite: NEGATIVE
Protein, ur: 100 mg/dL — AB
Specific Gravity, Urine: 1.027 (ref 1.005–1.030)
pH: 7 (ref 5.0–8.0)

## 2021-09-25 LAB — COMPREHENSIVE METABOLIC PANEL
ALT: 91 U/L — ABNORMAL HIGH (ref 0–44)
AST: 102 U/L — ABNORMAL HIGH (ref 15–41)
Albumin: 3.9 g/dL (ref 3.5–5.0)
Alkaline Phosphatase: 275 U/L — ABNORMAL HIGH (ref 38–126)
Anion gap: 7 (ref 5–15)
BUN: 12 mg/dL (ref 6–20)
CO2: 22 mmol/L (ref 22–32)
Calcium: 8.9 mg/dL (ref 8.9–10.3)
Chloride: 102 mmol/L (ref 98–111)
Creatinine, Ser: 0.62 mg/dL (ref 0.44–1.00)
GFR, Estimated: >60 mL/min (ref >60)
Glucose, Bld: 119 mg/dL — ABNORMAL HIGH (ref 70–99)
Potassium: 3.4 mmol/L — ABNORMAL LOW (ref 3.5–5.1)
Sodium: 131 mmol/L — ABNORMAL LOW (ref 135–145)
Total Bilirubin: 0.8 mg/dL (ref 0.3–1.2)
Total Protein: 8.4 g/dL — ABNORMAL HIGH (ref 6.5–8.1)

## 2021-09-25 LAB — CBC
HCT: 32.1 % — ABNORMAL LOW (ref 36.0–46.0)
Hemoglobin: 9.9 g/dL — ABNORMAL LOW (ref 12.0–15.0)
MCH: 22.1 pg — ABNORMAL LOW (ref 26.0–34.0)
MCHC: 30.8 g/dL (ref 30.0–36.0)
MCV: 71.7 fL — ABNORMAL LOW (ref 80.0–100.0)
Platelets: 414 10*3/uL — ABNORMAL HIGH (ref 150–400)
RBC: 4.48 MIL/uL (ref 3.87–5.11)
RDW: 16.7 % — ABNORMAL HIGH (ref 11.5–15.5)
WBC: 13.8 10*3/uL — ABNORMAL HIGH (ref 4.0–10.5)
nRBC: 0 % (ref 0.0–0.2)

## 2021-09-25 LAB — RESP PANEL BY RT-PCR (FLU A&B, COVID) ARPGX2
Influenza A by PCR: NEGATIVE
Influenza B by PCR: NEGATIVE
SARS Coronavirus 2 by RT PCR: NEGATIVE

## 2021-09-25 LAB — LIPASE, BLOOD: Lipase: 25 U/L (ref 11–51)

## 2021-09-25 LAB — POC URINE PREG, ED: Preg Test, Ur: NEGATIVE

## 2021-09-25 MED ORDER — IOHEXOL 300 MG/ML  SOLN
100.0000 mL | Freq: Once | INTRAMUSCULAR | Status: AC | PRN
  Administered 2021-09-25: 100 mL via INTRAVENOUS

## 2021-09-25 MED ORDER — ACETAMINOPHEN 500 MG PO TABS
1000.0000 mg | ORAL_TABLET | Freq: Once | ORAL | Status: AC
  Administered 2021-09-25: 1000 mg via ORAL
  Filled 2021-09-25: qty 2

## 2021-09-25 MED ORDER — MORPHINE SULFATE (PF) 4 MG/ML IV SOLN
4.0000 mg | Freq: Once | INTRAVENOUS | Status: AC
  Administered 2021-09-25: 4 mg via INTRAVENOUS
  Filled 2021-09-25: qty 1

## 2021-09-25 MED ORDER — SODIUM CHLORIDE 0.9 % IV BOLUS
1000.0000 mL | Freq: Once | INTRAVENOUS | Status: AC
  Administered 2021-09-25: 1000 mL via INTRAVENOUS

## 2021-09-25 MED ORDER — ONDANSETRON HCL 4 MG/2ML IJ SOLN
4.0000 mg | Freq: Once | INTRAMUSCULAR | Status: AC
  Administered 2021-09-25: 4 mg via INTRAVENOUS
  Filled 2021-09-25: qty 2

## 2021-09-25 NOTE — ED Provider Notes (Signed)
[facility] EMERGENCY DEPARTMENT Provider Note   CSN: [id number] Arrival date & time: 09/25/21  1907     History  Chief Complaint  Patient presents with   Fever   Abdominal Pain    Diana Carr is a 36 y.o. female with no significant past medical history presenting with a 1 day history of periumbilical abdominal pain that radiates up into her epigastric region in association with nausea without emesis, multiple episodes of nonbloody diarrhea today, nonproductive cough with shortness of breath, subjective fever, of note 101.2 upon arrival here.  She also endorses generalized back pain and general myalgias.  She denies increased urinary frequency, dysuria, vaginal discharge, rash, sore throat, neck pain or stiffness, also no chest pain, cough..  She has had no treatment prior to arrival.  She has had no exposures to others with similar symptoms.  The history is provided by the patient and a relative (adult daughter at bedside).       Home Medications Prior to Admission medications   Medication Sig Start Date End Date Taking? Authorizing Provider  acetaminophen (TYLENOL) 500 MG tablet Take 1,000 mg by mouth every 6 (six) hours as needed.   Yes [provider]  aspirin 325 MG tablet Take 650 mg by mouth daily.   Yes [provider]  lidocaine (LIDODERM) 5 % Place 1 patch onto the skin daily. Remove & Discard patch within 12 hours or as directed by MD 09/14/21  Yes Kommor, Madison, MD  naproxen (NAPROSYN) 375 MG tablet Take 1 tablet (375 mg total) by mouth 2 (two) times daily. Patient not taking: Reported on 09/25/2021 09/14/21   Teressa Lower, MD      Allergies    Patient has no known allergies.    Review of Systems   Review of Systems  Constitutional:  Positive for fatigue and fever.  HENT:  Negative for congestion and sore throat.   Eyes: Negative.   Respiratory:  Positive for shortness of breath. Negative for cough and chest tightness.    Cardiovascular:  Negative for chest pain.  Gastrointestinal:  Positive for abdominal pain, diarrhea and nausea. Negative for vomiting.  Genitourinary: Negative.   Musculoskeletal:  Positive for back pain. Negative for arthralgias, joint swelling and neck pain.  Skin: Negative.  Negative for rash and wound.  Neurological:  Negative for dizziness, weakness, light-headedness, numbness and headaches.  Psychiatric/Behavioral: Negative.    All other systems reviewed and are negative.   Physical Exam Updated Vital Signs BP (!) 90/54   Pulse 84   Temp 99.9 F (37.7 C) (Oral)   Resp 18   Ht _0  (1.575 m)   Wt 52.2 kg   LMP 09/07/2021 (Exact Date)   SpO2 100%   BMI 21.03 kg/m  Physical Exam Vitals and nursing note reviewed.  Constitutional:      Appearance: She is well-developed.  HENT:     Head: Normocephalic and atraumatic.  Eyes:     Conjunctiva/sclera: Conjunctivae normal.  Cardiovascular:     Rate and Rhythm: Normal rate and regular rhythm.     Heart sounds: Normal heart sounds.  Pulmonary:     Effort: Pulmonary effort is normal.     Breath sounds: Normal breath sounds. No stridor. No wheezing, rhonchi or rales.  Abdominal:     General: Abdomen is flat. Bowel sounds are normal.     Palpations: Abdomen is soft.     Tenderness: There is abdominal tenderness in the epigastric area and periumbilical area. There is  no guarding.  Musculoskeletal:        General: Normal range of motion.     Cervical back: Normal range of motion.  Skin:    General: Skin is warm and dry.  Neurological:     Mental Status: She is alert.     ED Results / Procedures / Treatments   Labs (all labs ordered are listed, but only abnormal results are displayed) Labs Reviewed  COMPREHENSIVE METABOLIC PANEL - Abnormal; Notable for the following components:      Result Value   Sodium 131 (*)    Potassium 3.4 (*)    Glucose, Bld 119 (*)    Total Protein 8.4 (*)    AST 102 (*)    ALT 91 (*)     Alkaline Phosphatase 275 (*)    All other components within normal limits  CBC - Abnormal; Notable for the following components:   WBC 13.8 (*)    Hemoglobin 9.9 (*)    HCT 32.1 (*)    MCV 71.7 (*)    MCH 22.1 (*)    RDW 16.7 (*)    Platelets 414 (*)    All other components within normal limits  URINALYSIS, ROUTINE W REFLEX MICROSCOPIC - Abnormal; Notable for the following components:   APPearance HAZY (*)    Hgb urine dipstick SMALL (*)    Ketones, ur 80 (*)    Protein, ur 100 (*)    Leukocytes,Ua TRACE (*)    All other components within normal limits  RESP PANEL BY RT-PCR (FLU A&B, COVID) ARPGX2  LIPASE, BLOOD  POC URINE PREG, ED  GC/CHLAMYDIA PROBE AMP ([place]) NOT AT [facility]    EKG None  Radiology CT ABDOMEN PELVIS W CONTRAST  Result Date: 09/25/2021 CLINICAL DATA:  Acute abdominal pain. EXAM: CT ABDOMEN AND PELVIS WITH CONTRAST TECHNIQUE: Multidetector CT imaging of the abdomen and pelvis was performed using the standard protocol following bolus administration of intravenous contrast. RADIATION DOSE REDUCTION: This exam was performed according to the departmental dose-optimization program which includes automated exposure control, adjustment of the mA and/or kV according to patient size and/or use of iterative reconstruction technique. CONTRAST:  162m OMNIPAQUE IOHEXOL 300 MG/ML  SOLN COMPARISON:  None Available. FINDINGS: Lower chest: No acute abnormality. Hepatobiliary: No focal liver abnormality is seen. No gallstones, gallbladder wall thickening, or biliary dilatation. Pancreas: Unremarkable. No pancreatic ductal dilatation or surrounding inflammatory changes. Spleen: Normal in size without focal abnormality. Adrenals/Urinary Tract: Adrenal glands are unremarkable. Kidneys are normal, without renal calculi, focal lesion, or hydronephrosis. Bladder is unremarkable. Stomach/Bowel: Stomach is within normal limits. Appendix appears normal. No evidence of bowel wall thickening,  distention, or inflammatory changes. Vascular/Lymphatic: No significant vascular findings are present. No enlarged abdominal or pelvic lymph nodes. Reproductive: IUD seen in the pelvis. There is a thick-walled fluid-filled tubular structure in the left adnexa measuring 2.5 cm in diameter. There is a rounded low-attenuation area in the left adnexa measuring 2.5 cm with surrounding free fluid. Right ovary unremarkable. Other: Small amount of free fluid in the pelvis and right adnexa. No free intraperitoneal air or focal abdominal wall hernia. Musculoskeletal: No acute or significant osseous findings. IMPRESSION: 1. Thick-walled tubular fluid-filled structure in the left adnexa worrisome for dilated fallopian tube with superimposed infection/pelvic inflammatory disease. 2. 2.5 cm rounded low-attenuation area in the left adnexa with surrounding free fluid may represent left ovarian cyst or abscess. 3. Small amount of free fluid in the pelvis. Electronically Signed   By: AWarren Lacy  Dagoberto Reef M.D.   On: 09/25/2021 23:49    Procedures Procedures    Medications Ordered in ED Medications  cefTRIAXone (ROCEPHIN) 1 g in sodium chloride 0.9 % 100 mL IVPB (has no administration in time range)  doxycycline (VIBRAMYCIN) 100 mg in sodium chloride 0.9 % 250 mL IVPB (has no administration in time range)  metroNIDAZOLE (FLAGYL) IVPB 500 mg (has no administration in time range)  acetaminophen (TYLENOL) tablet 1,000 mg (1,000 mg Oral Given 09/25/21 2035)  sodium chloride 0.9 % bolus 1,000 mL (0 mLs Intravenous Stopped 09/25/21 2341)  morphine (PF) 4 MG/ML injection 4 mg (4 mg Intravenous Given 09/25/21 2157)  ondansetron (ZOFRAN) injection 4 mg (4 mg Intravenous Given 09/25/21 2156)  iohexol (OMNIPAQUE) 300 MG/ML solution 100 mL (100 mLs Intravenous Contrast Given 09/25/21 2324)    ED Course/ Medical Decision Making/ A&P                           Medical Decision Making Patient with a 3-day history of mid to upper abdominal  pain, back pain fever and chills, diarrhea, CT imaging confirming probable left-sided PID/TOA.  Amount and/or Complexity of Data Reviewed Labs: ordered.    Details: Labs are significant for WBC count of 13.8, patient has a hemoglobin of 9.9, this appears to be a microcytic anemia.  She also has an elevation in her LFTs with an AST of 102, and ALT of 91 and an alk phos of 275, unclear etiology, no known history of hepatic disease. Radiology: ordered.    Details: CT positive for left adnexal fluid-filled structure concerning for left ovarian cyst or abscess, PID. Discussion of management or test interpretation with external provider(s): Patient discussed with Dr. Damita Dunnings with gynecology who recommends patient admission for IV antibiotics including Rocephin, doxycycline and Flagyl.  Patient to be transferred [facility] at [facility].  Risk OTC drugs. Prescription drug management.           Final Clinical Impression(s) / ED Diagnoses Final diagnoses:  TOA (tubo-ovarian abscess)    Rx / DC Orders ED Discharge Orders     None         Landis Martins 09/26/21 0050    Teressa Lower, MD 09/26/21 [phone]

## 2021-09-25 NOTE — ED Triage Notes (Signed)
Abd pain, back pain, fever, chills, diarrhea and sob started yesterday.

## 2021-09-25 NOTE — ED Provider Notes (Incomplete)
[facility] EMERGENCY DEPARTMENT Provider Note   CSN: [id number] Arrival date & time: 09/25/21  1907     History {Add pertinent medical, surgical, social history, OB history to HPI:1} Chief Complaint  Patient presents with  . Fever  . Abdominal Pain    Diana Carr is a 36 y.o. female with no significant past medical history presenting with a 1 day history of periumbilical abdominal pain that radiates up into her epigastric region in association with nausea without emesis, multiple episodes of nonbloody diarrhea today, nonproductive cough with shortness of breath, subjective fever, of note 101.2 upon arrival here.  She also endorses generalized back pain and general myalgias.  She denies increased urinary frequency, dysuria, rash, sore throat, neck pain or stiffness, also no chest pain, cough..  She has had no treatment prior to arrival.  She has had no exposures to others with similar symptoms.  The history is provided by the patient and a relative (adult daughter at bedside).       Home Medications Prior to Admission medications   Medication Sig Start Date End Date Taking? Authorizing Provider  acetaminophen (TYLENOL) 500 MG tablet Take 1,000 mg by mouth every 6 (six) hours as needed.   Yes [provider]  aspirin 325 MG tablet Take 650 mg by mouth daily.   Yes [provider]  lidocaine (LIDODERM) 5 % Place 1 patch onto the skin daily. Remove & Discard patch within 12 hours or as directed by MD 09/14/21  Yes Kommor, Madison, MD  naproxen (NAPROSYN) 375 MG tablet Take 1 tablet (375 mg total) by mouth 2 (two) times daily. Patient not taking: Reported on 09/25/2021 09/14/21   Glendora Score, MD      Allergies    Patient has no known allergies.    Review of Systems   Review of Systems  Constitutional:  Positive for fatigue and fever.  HENT:  Negative for congestion and sore throat.   Eyes: Negative.   Respiratory:  Positive for shortness of breath.  Negative for cough and chest tightness.   Cardiovascular:  Negative for chest pain.  Gastrointestinal:  Positive for abdominal pain, diarrhea and nausea. Negative for vomiting.  Genitourinary: Negative.   Musculoskeletal:  Positive for back pain. Negative for arthralgias, joint swelling and neck pain.  Skin: Negative.  Negative for rash and wound.  Neurological:  Negative for dizziness, weakness, light-headedness, numbness and headaches.  Psychiatric/Behavioral: Negative.    All other systems reviewed and are negative.   Physical Exam Updated Vital Signs BP (!) 90/54   Pulse 84   Temp 99.9 F (37.7 C) (Oral)   Resp 18   Ht 5\' 2"  (1.575 m)   Wt 52.2 kg   LMP 09/07/2021 (Exact Date)   SpO2 100%   BMI 21.03 kg/m  Physical Exam Vitals and nursing note reviewed.  Constitutional:      Appearance: She is well-developed.  HENT:     Head: Normocephalic and atraumatic.  Eyes:     Conjunctiva/sclera: Conjunctivae normal.  Cardiovascular:     Rate and Rhythm: Normal rate and regular rhythm.     Heart sounds: Normal heart sounds.  Pulmonary:     Effort: Pulmonary effort is normal.     Breath sounds: Normal breath sounds. No stridor. No wheezing, rhonchi or rales.  Abdominal:     General: Abdomen is flat. Bowel sounds are normal.     Palpations: Abdomen is soft.     Tenderness: There is abdominal tenderness in the  epigastric area and periumbilical area. There is no guarding.  Musculoskeletal:        General: Normal range of motion.     Cervical back: Normal range of motion.  Skin:    General: Skin is warm and dry.  Neurological:     Mental Status: She is alert.     ED Results / Procedures / Treatments   Labs (all labs ordered are listed, but only abnormal results are displayed) Labs Reviewed  COMPREHENSIVE METABOLIC PANEL - Abnormal; Notable for the following components:      Result Value   Sodium 131 (*)    Potassium 3.4 (*)    Glucose, Bld 119 (*)    Total Protein 8.4  (*)    AST 102 (*)    ALT 91 (*)    Alkaline Phosphatase 275 (*)    All other components within normal limits  CBC - Abnormal; Notable for the following components:   WBC 13.8 (*)    Hemoglobin 9.9 (*)    HCT 32.1 (*)    MCV 71.7 (*)    MCH 22.1 (*)    RDW 16.7 (*)    Platelets 414 (*)    All other components within normal limits  URINALYSIS, ROUTINE W REFLEX MICROSCOPIC - Abnormal; Notable for the following components:   APPearance HAZY (*)    Hgb urine dipstick SMALL (*)    Ketones, ur 80 (*)    Protein, ur 100 (*)    Leukocytes,Ua TRACE (*)    All other components within normal limits  RESP PANEL BY RT-PCR (FLU A&B, COVID) ARPGX2  LIPASE, BLOOD  POC URINE PREG, ED    EKG None  Radiology No results found.  Procedures Procedures  {Document cardiac monitor, telemetry assessment procedure when appropriate:1}  Medications Ordered in ED Medications  acetaminophen (TYLENOL) tablet 1,000 mg (1,000 mg Oral Given 09/25/21 2035)  sodium chloride 0.9 % bolus 1,000 mL (0 mLs Intravenous Stopped 09/25/21 2341)  morphine (PF) 4 MG/ML injection 4 mg (4 mg Intravenous Given 09/25/21 2157)  ondansetron (ZOFRAN) injection 4 mg (4 mg Intravenous Given 09/25/21 2156)  iohexol (OMNIPAQUE) 300 MG/ML solution 100 mL (100 mLs Intravenous Contrast Given 09/25/21 2324)    ED Course/ Medical Decision Making/ A&P                           Medical Decision Making Amount and/or Complexity of Data Reviewed Labs: ordered. Radiology: ordered.  Risk OTC drugs. Prescription drug management.     {Document critical care time when appropriate:1} {Document review of labs and clinical decision tools ie heart score, Chads2Vasc2 etc:1}  {Document your independent review of radiology images, and any outside records:1} {Document your discussion with family members, caretakers, and with consultants:1} {Document social determinants of health affecting pt's care:1} {Document your decision making why  or why not admission, treatments were needed:1} Final Clinical Impression(s) / ED Diagnoses Final diagnoses:  None    Rx / DC Orders ED Discharge Orders     None

## 2021-09-26 ENCOUNTER — Encounter (HOSPITAL_COMMUNITY): Payer: Self-pay | Admitting: Obstetrics and Gynecology

## 2021-09-26 DIAGNOSIS — N730 Acute parametritis and pelvic cellulitis: Secondary | ICD-10-CM

## 2021-09-26 DIAGNOSIS — N7093 Salpingitis and oophoritis, unspecified: Principal | ICD-10-CM | POA: Diagnosis present

## 2021-09-26 LAB — CBC WITH DIFFERENTIAL/PLATELET
Abs Immature Granulocytes: 0.04 10*3/uL (ref 0.00–0.07)
Basophils Absolute: 0 10*3/uL (ref 0.0–0.1)
Basophils Relative: 0 %
Eosinophils Absolute: 0 10*3/uL (ref 0.0–0.5)
Eosinophils Relative: 0 %
HCT: 27 % — ABNORMAL LOW (ref 36.0–46.0)
Hemoglobin: 8.4 g/dL — ABNORMAL LOW (ref 12.0–15.0)
Immature Granulocytes: 0 %
Lymphocytes Relative: 4 %
Lymphs Abs: 0.4 10*3/uL — ABNORMAL LOW (ref 0.7–4.0)
MCH: 22 pg — ABNORMAL LOW (ref 26.0–34.0)
MCHC: 31.1 g/dL (ref 30.0–36.0)
MCV: 70.7 fL — ABNORMAL LOW (ref 80.0–100.0)
Monocytes Absolute: 0.4 10*3/uL (ref 0.1–1.0)
Monocytes Relative: 4 %
Neutro Abs: 8.9 10*3/uL — ABNORMAL HIGH (ref 1.7–7.7)
Neutrophils Relative %: 92 %
Platelets: 315 10*3/uL (ref 150–400)
RBC: 3.82 MIL/uL — ABNORMAL LOW (ref 3.87–5.11)
RDW: 16.7 % — ABNORMAL HIGH (ref 11.5–15.5)
WBC: 9.8 10*3/uL (ref 4.0–10.5)
nRBC: 0 % (ref 0.0–0.2)

## 2021-09-26 LAB — COMPREHENSIVE METABOLIC PANEL
ALT: 57 U/L — ABNORMAL HIGH (ref 0–44)
AST: 45 U/L — ABNORMAL HIGH (ref 15–41)
Albumin: 2.9 g/dL — ABNORMAL LOW (ref 3.5–5.0)
Alkaline Phosphatase: 208 U/L — ABNORMAL HIGH (ref 38–126)
Anion gap: 9 (ref 5–15)
BUN: 7 mg/dL (ref 6–20)
CO2: 20 mmol/L — ABNORMAL LOW (ref 22–32)
Calcium: 8.3 mg/dL — ABNORMAL LOW (ref 8.9–10.3)
Chloride: 106 mmol/L (ref 98–111)
Creatinine, Ser: 0.58 mg/dL (ref 0.44–1.00)
GFR, Estimated: >60 mL/min (ref >60)
Glucose, Bld: 96 mg/dL (ref 70–99)
Potassium: 3.3 mmol/L — ABNORMAL LOW (ref 3.5–5.1)
Sodium: 135 mmol/L (ref 135–145)
Total Bilirubin: 0.9 mg/dL (ref 0.3–1.2)
Total Protein: 6.6 g/dL (ref 6.5–8.1)

## 2021-09-26 LAB — GC/CHLAMYDIA PROBE AMP (~~LOC~~) NOT AT ARMC
Chlamydia: NEGATIVE
Comment: NEGATIVE
Comment: NORMAL
Neisseria Gonorrhea: NEGATIVE

## 2021-09-26 MED ORDER — POTASSIUM CHLORIDE 10 MEQ/100ML IV SOLN
10.0000 meq | INTRAVENOUS | Status: AC
  Administered 2021-09-26 (×3): 10 meq via INTRAVENOUS
  Filled 2021-09-26 (×3): qty 100

## 2021-09-26 MED ORDER — ACETAMINOPHEN 325 MG PO TABS
650.0000 mg | ORAL_TABLET | ORAL | Status: DC | PRN
  Administered 2021-09-26: 650 mg via ORAL
  Filled 2021-09-26 (×2): qty 2

## 2021-09-26 MED ORDER — SODIUM CHLORIDE 0.9 % IV SOLN
1.0000 g | INTRAVENOUS | Status: DC
  Administered 2021-09-26: 1 g via INTRAVENOUS
  Filled 2021-09-26: qty 10

## 2021-09-26 MED ORDER — OXYCODONE HCL 5 MG PO TABS
5.0000 mg | ORAL_TABLET | ORAL | Status: DC | PRN
  Administered 2021-09-26: 5 mg via ORAL
  Filled 2021-09-26: qty 1

## 2021-09-26 MED ORDER — SODIUM CHLORIDE 0.9 % IV SOLN
1.0000 g | Freq: Once | INTRAVENOUS | Status: AC
  Administered 2021-09-26: 1 g via INTRAVENOUS
  Filled 2021-09-26: qty 10

## 2021-09-26 MED ORDER — SODIUM CHLORIDE 0.9 % IV SOLN
100.0000 mg | Freq: Two times a day (BID) | INTRAVENOUS | Status: DC
  Administered 2021-09-26 – 2021-09-27 (×3): 100 mg via INTRAVENOUS
  Filled 2021-09-26 (×5): qty 100

## 2021-09-26 MED ORDER — ONDANSETRON HCL 4 MG PO TABS: 4.0000 mg | ORAL_TABLET | Freq: Four times a day (QID) | ORAL | Status: DC | PRN

## 2021-09-26 MED ORDER — ZOLPIDEM TARTRATE 5 MG PO TABS
5.0000 mg | ORAL_TABLET | Freq: Every evening | ORAL | Status: DC | PRN
Start: 2021-09-26 — End: 2021-09-27

## 2021-09-26 MED ORDER — DIPHENHYDRAMINE HCL 25 MG PO CAPS: 25.0000 mg | ORAL_CAPSULE | Freq: Four times a day (QID) | ORAL | Status: DC | PRN

## 2021-09-26 MED ORDER — ONDANSETRON HCL 4 MG/2ML IJ SOLN: 4.0000 mg | Freq: Four times a day (QID) | INTRAMUSCULAR | Status: DC | PRN

## 2021-09-26 MED ORDER — IBUPROFEN 600 MG PO TABS
600.0000 mg | ORAL_TABLET | Freq: Four times a day (QID) | ORAL | Status: DC | PRN
  Administered 2021-09-26 (×2): 600 mg via ORAL
  Filled 2021-09-26 (×2): qty 1

## 2021-09-26 MED ORDER — METRONIDAZOLE 500 MG/100ML IV SOLN
500.0000 mg | Freq: Two times a day (BID) | INTRAVENOUS | Status: DC
Start: 2021-09-26 — End: 2021-09-27
  Administered 2021-09-26 (×3): 500 mg via INTRAVENOUS
  Filled 2021-09-26 (×4): qty 100

## 2021-09-26 NOTE — ED Notes (Signed)
Pt ambulated to restroom. 

## 2021-09-26 NOTE — ED Notes (Signed)
Report given to carelink, ETA of 15 mins  

## 2021-09-26 NOTE — Consult Note (Signed)
   OB/GYN Telephone Consult  Tiffiany Beadles is a 36 y.o. [id number] presenting with pain and fever.   I was called for a consult regarding the care of this patient by [facility]                                                           and [facility].    The provider had a clinical question regarding treatment for PID vs TOA.   The provider presented the following relevant clinical information and I performed a chart review on the patient and reviewed available documentation:  She is a 36 yo 2/2 who presented with abdominal pain, fever, diarrhea, and SOB. She was febrile on presentation to 101.2 and mildly tachycardic. She had a CT which showed a 2.5 possible TOA vs dilated fallopian tube with PID. She does have an IUD.   Her WBC count is 13.8. LFTS elevated mildly, AST 102, ALT 91 likely due to inflammation.   I reviewed CareEverywhere: no pertinent information  BP (!) 90/54   Pulse 84   Temp 99.9 F (37.7 C) (Oral)   Resp 18   Ht 5\' 2"  (1.575 m)   Wt 52.2 kg   LMP 09/07/2021 (Exact Date)   SpO2 100%   BMI 21.03 kg/m   Exam- performed by consulting provider   Recommendations:  - Would treat with antibiotics given <4cm size of possible TOA. Would do ceftriaxone, doxy and flagyl. Would expect clinical improved in 48-72 hours. Once afebrile for 48 hours, she would continue on home regimen (doxy and flagyl) for two weeks with follow up with 09-27-1981 in two weeks. She could follow up with Dr. [place] or Dr. Despina Hidden.  - Surgery would not be indicated at this time - generally not first line for TOA and would not suggest IR drainage at this time due to small size. Surgery is generally reserved for postmenopausal TOAs, ruptured TOAs, or large TOAs refractory to conservative measures.  - Can trend LFTs but expect they improve as infection/PID improves. May have component of [place] [place].  - Would leave IUD in place unless not improved after 48-72 hours - GC/CT recommended -  pt may do self swab.   -Recommended MD/APP provide the patient with a referral [facility]) for follow up in  2 weeks.   Thank you for this consult and if additional recommendations are needed please call [phone] for the OB/GYN attending on service at [facility].   I spent approximately 5 minutes directly consulting with the provider and verbally discussing this case. Additionally 15 minutes minutes was spent performing chart review and documentation.   [facility], MD Attending Obstetrician & Gynecologist, [facility] for [facility], [facility]
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
## 2021-09-27 DIAGNOSIS — N7093 Salpingitis and oophoritis, unspecified: Principal | ICD-10-CM

## 2021-09-27 LAB — COMPREHENSIVE METABOLIC PANEL
ALT: 44 U/L (ref 0–44)
AST: 28 U/L (ref 15–41)
Albumin: 2.7 g/dL — ABNORMAL LOW (ref 3.5–5.0)
Alkaline Phosphatase: 281 U/L — ABNORMAL HIGH (ref 38–126)
Anion gap: 11 (ref 5–15)
BUN: 7 mg/dL (ref 6–20)
CO2: 19 mmol/L — ABNORMAL LOW (ref 22–32)
Calcium: 8.3 mg/dL — ABNORMAL LOW (ref 8.9–10.3)
Chloride: 106 mmol/L (ref 98–111)
Creatinine, Ser: 0.62 mg/dL (ref 0.44–1.00)
GFR, Estimated: >60 mL/min (ref >60)
Glucose, Bld: 101 mg/dL — ABNORMAL HIGH (ref 70–99)
Potassium: 3.6 mmol/L (ref 3.5–5.1)
Sodium: 136 mmol/L (ref 135–145)
Total Bilirubin: 0.7 mg/dL (ref 0.3–1.2)
Total Protein: 6.5 g/dL (ref 6.5–8.1)

## 2021-09-27 LAB — CBC WITH DIFFERENTIAL/PLATELET
Abs Immature Granulocytes: 0.03 10*3/uL (ref 0.00–0.07)
Basophils Absolute: 0 10*3/uL (ref 0.0–0.1)
Basophils Relative: 0 %
Eosinophils Absolute: 0.1 10*3/uL (ref 0.0–0.5)
Eosinophils Relative: 1 %
HCT: 26.4 % — ABNORMAL LOW (ref 36.0–46.0)
Hemoglobin: 8.1 g/dL — ABNORMAL LOW (ref 12.0–15.0)
Immature Granulocytes: 0 %
Lymphocytes Relative: 5 %
Lymphs Abs: 0.4 10*3/uL — ABNORMAL LOW (ref 0.7–4.0)
MCH: 21.6 pg — ABNORMAL LOW (ref 26.0–34.0)
MCHC: 30.7 g/dL (ref 30.0–36.0)
MCV: 70.4 fL — ABNORMAL LOW (ref 80.0–100.0)
Monocytes Absolute: 0.5 10*3/uL (ref 0.1–1.0)
Monocytes Relative: 6 %
Neutro Abs: 7.1 10*3/uL (ref 1.7–7.7)
Neutrophils Relative %: 88 %
Platelets: 341 10*3/uL (ref 150–400)
RBC: 3.75 MIL/uL — ABNORMAL LOW (ref 3.87–5.11)
RDW: 16.9 % — ABNORMAL HIGH (ref 11.5–15.5)
WBC: 8.1 10*3/uL (ref 4.0–10.5)
nRBC: 0 % (ref 0.0–0.2)

## 2021-09-27 MED ORDER — METRONIDAZOLE 500 MG PO TABS: 500.0000 mg | ORAL_TABLET | Freq: Two times a day (BID) | ORAL | 0 refills | Status: DC

## 2021-09-27 MED ORDER — FERROUS SULFATE 325 (65 FE) MG PO TABS: 325.0000 mg | ORAL_TABLET | ORAL | 1 refills | Status: AC

## 2021-09-27 MED ORDER — DOXYCYCLINE HYCLATE 100 MG PO CAPS: 100.0000 mg | ORAL_CAPSULE | Freq: Two times a day (BID) | ORAL | 0 refills | Status: DC

## 2021-09-27 MED ORDER — OXYCODONE-ACETAMINOPHEN 5-325 MG PO TABS: 1.0000 | ORAL_TABLET | Freq: Four times a day (QID) | ORAL | 0 refills | Status: AC | PRN

## 2021-09-27 MED ORDER — IBUPROFEN 600 MG PO TABS: 600.0000 mg | ORAL_TABLET | Freq: Four times a day (QID) | ORAL | 0 refills | Status: AC | PRN

## 2021-09-27 NOTE — Discharge Summary (Signed)
Physician Discharge Summary  Patient ID: Diana Carr MRN: [id number] DOB/AGE: 1985-03-21 36 y.o.  Admit date: 09/25/2021 Discharge date: 09/27/2021  Admission Diagnoses: TOA  Discharge Diagnoses:  Principal Problem:   TOA (tubo-ovarian abscess)   Discharged Condition: good  Hospital Course: Patient admitted with radiologic findings consistent with TOA, fever 101 and WBC 13. She received IV antibiotics, remained afebrile for over 24 hours and her WBC normalized. Patient was found stable to be discharged home and complete a 14-day course of oral antibiotics. Plan is for her to follow up in the office in 2 weeks.  Consults: None   Discharge Exam: Blood pressure (!) 90/44, pulse 90, temperature 98.6 F (37 C), temperature source Oral, resp. rate 16, height 5\' 2"  (1.575 m), weight 52.2 kg, last menstrual period 09/07/2021, SpO2 100 %, unknown if currently breastfeeding. GENERAL: Well-developed, well-nourished female in no acute distress.  LUNGS: Clear to auscultation bilaterally.  HEART: Regular rate and rhythm. ABDOMEN: Soft, nontender, nondistended. No organomegaly. PELVIC: Not performed EXTREMITIES: No cyanosis, clubbing, or edema, 2+ distal pulses.   Disposition: Home   Allergies as of 09/27/2021   No Known Allergies      Medication List     STOP taking these medications    naproxen 375 MG tablet Commonly known as: NAPROSYN       TAKE these medications    acetaminophen 500 MG tablet Commonly known as: TYLENOL Take 1,000 mg by mouth every 6 (six) hours as needed.   aspirin 325 MG tablet Take 650 mg by mouth daily.   doxycycline 100 MG capsule Commonly known as: VIBRAMYCIN Take 1 capsule (100 mg total) by mouth 2 (two) times daily.   ferrous sulfate 325 (65 FE) MG tablet Commonly known as: FerrouSul Take 1 tablet (325 mg total) by mouth every other day.   ibuprofen 600 MG tablet Commonly known as: ADVIL Take 1 tablet (600 mg total) by mouth  every 6 (six) hours as needed for fever or headache.   lidocaine 5 % Commonly known as: Lidoderm Place 1 patch onto the skin daily. Remove & Discard patch within 12 hours or as directed by MD   metroNIDAZOLE 500 MG tablet Commonly known as: FLAGYL Take 1 tablet (500 mg total) by mouth 2 (two) times daily for 14 days.   oxyCODONE-acetaminophen 5-325 MG tablet Commonly known as: PERCOCET/ROXICET Take 1-2 tablets by mouth every 6 (six) hours as needed.        Follow-up Information     [facility] Follow up.   Specialty: Obstetrics and Gynecology Why: An appointment will be made for you to follow up in 2 weeks Contact information: [address] [place] [phone]                Signed: [phone] 09/27/2021, 9:49 AM

## 2021-10-02 ENCOUNTER — Other Ambulatory Visit: Payer: Self-pay

## 2021-10-02 ENCOUNTER — Encounter (HOSPITAL_COMMUNITY): Payer: Self-pay | Admitting: *Deleted

## 2021-10-02 ENCOUNTER — Emergency Department (HOSPITAL_COMMUNITY)
Admission: EM | Admit: 2021-10-02 | Discharge: 2021-10-03 | Disposition: A | Discharge status: Home / Self Care | Payer: Self-pay | Attending: Emergency Medicine | Admitting: Emergency Medicine

## 2021-10-02 DIAGNOSIS — Z7982 Long term (current) use of aspirin: Secondary | ICD-10-CM | POA: Insufficient documentation

## 2021-10-02 DIAGNOSIS — T7840XA Allergy, unspecified, initial encounter: Secondary | ICD-10-CM | POA: Insufficient documentation

## 2021-10-02 NOTE — ED Triage Notes (Signed)
Possible allergic reaction to flagyl or doxycycline that she started on Thursday.  Rash to bottom and bilateral wrists R>L.   C/o abd pain.  Denies any N/V/D.  Denies any SOB

## 2021-10-03 MED ORDER — AMOXICILLIN-POT CLAVULANATE 875-125 MG PO TABS: 1.0000 | ORAL_TABLET | Freq: Two times a day (BID) | ORAL | 0 refills | Status: DC

## 2021-10-03 NOTE — ED Provider Notes (Signed)
[facility] EMERGENCY DEPARTMENT Provider Note   CSN: [id number] Arrival date & time: 10/02/21  1947     History  Chief Complaint  Patient presents with   Allergic Reaction    Diana Carr is a 36 y.o. female.  Patient presents to the emergency department for evaluation of rash.  Patient was hospitalized at [facility] 1 week ago for tubo-ovarian abscess.  She was discharged on doxycycline and Flagyl.  She reports that she started to notice an outbreak of rash on her arms and torso when she took the medication yesterday.  She has not taken any of the meds today.  No tongue swelling, throat swelling, difficulty swallowing or shortness of breath.       Home Medications Prior to Admission medications   Medication Sig Start Date End Date Taking? Authorizing Provider  amoxicillin-clavulanate (AUGMENTIN) 875-125 MG tablet Take 1 tablet by mouth every 12 (twelve) hours. 10/03/21  Yes Danaka Llera, Canary Brim, MD  acetaminophen (TYLENOL) 500 MG tablet Take 1,000 mg by mouth every 6 (six) hours as needed.    [provider]  aspirin 325 MG tablet Take 650 mg by mouth daily.    [provider]  ferrous sulfate (FERROUSUL) 325 (65 FE) MG tablet Take 1 tablet (325 mg total) by mouth every other day. 09/27/21   Constant, Peggy, MD  ibuprofen (ADVIL) 600 MG tablet Take 1 tablet (600 mg total) by mouth every 6 (six) hours as needed for fever or headache. 09/27/21   Constant, Peggy, MD  lidocaine (LIDODERM) 5 % Place 1 patch onto the skin daily. Remove & Discard patch within 12 hours or as directed by MD 09/14/21   Kommor, Wyn Forster, MD  oxyCODONE-acetaminophen (PERCOCET/ROXICET) 5-325 MG tablet Take 1-2 tablets by mouth every 6 (six) hours as needed. 09/27/21   Constant, Peggy, MD      Allergies    Doxycycline and Flagyl [metronidazole]    Review of Systems   Review of Systems  Physical Exam Updated Vital Signs BP (!) 93/59   Pulse (!) 50   Temp 98.7 F (37.1 C)  (Oral)   Resp 18   LMP 09/07/2021 (Exact Date)   SpO2 98%  Physical Exam Vitals and nursing note reviewed.  Constitutional:      General: She is not in acute distress.    Appearance: She is well-developed.  HENT:     Head: Normocephalic and atraumatic.     Mouth/Throat:     Mouth: Mucous membranes are moist.  Eyes:     General: Vision grossly intact. Gaze aligned appropriately.     Extraocular Movements: Extraocular movements intact.     Conjunctiva/sclera: Conjunctivae normal.  Cardiovascular:     Rate and Rhythm: Normal rate and regular rhythm.     Pulses: Normal pulses.     Heart sounds: Normal heart sounds, S1 normal and S2 normal. No murmur heard.    No friction rub. No gallop.  Pulmonary:     Effort: Pulmonary effort is normal. No respiratory distress.     Breath sounds: Normal breath sounds.  Abdominal:     General: Bowel sounds are normal.     Palpations: Abdomen is soft.     Tenderness: There is no abdominal tenderness. There is no guarding or rebound.     Hernia: No hernia is present.  Musculoskeletal:        General: No swelling.     Cervical back: Full passive range of motion without pain, normal range of motion and  neck supple. No spinous process tenderness or muscular tenderness. Normal range of motion.     Right lower leg: No edema.     Left lower leg: No edema.  Skin:    General: Skin is warm and dry.     Capillary Refill: Capillary refill takes less than 2 seconds.     Findings: No ecchymosis, erythema, rash or wound.  Neurological:     General: No focal deficit present.     Mental Status: She is alert and oriented to person, place, and time.     GCS: GCS eye subscore is 4. GCS verbal subscore is 5. GCS motor subscore is 6.     Cranial Nerves: Cranial nerves 2-12 are intact.     Sensory: Sensation is intact.     Motor: Motor function is intact.     Coordination: Coordination is intact.  Psychiatric:        Attention and Perception: Attention normal.         Mood and Affect: Mood normal.        Speech: Speech normal.        Behavior: Behavior normal.     ED Results / Procedures / Treatments   Labs (all labs ordered are listed, but only abnormal results are displayed) Labs Reviewed - No data to display  EKG None  Radiology No results found.  Procedures Procedures    Medications Ordered in ED Medications - No data to display  ED Course/ Medical Decision Making/ A&P                           Medical Decision Making  Patient presents to the emergency department with concerns over allergic reaction to antibiotics.  Patient describes rash on upper extremities, torso and buttocks.  Rash was worse yesterday, has faded.  She reports that when she took the medications yesterday the rash would come back and then fade.  She did not take it today.  Patient not experiencing any systemic symptoms.  Minimal if any objective findings currently.  I do not see any significant rash, hives and she appears well otherwise.  Nonetheless, must consider allergic reaction to one of the antibiotics.  Discussed with Dr. Despina Hidden, on-call for gynecology.  Recommends changing to Augmentin 875/125 twice a day for 10 days.  Encourage patient to follow-up as an outpatient as was previously arranged at time of discharge.        Final Clinical Impression(s) / ED Diagnoses Final diagnoses:  Allergic reaction, initial encounter    Rx / DC Orders ED Discharge Orders          Ordered    amoxicillin-clavulanate (AUGMENTIN) 875-125 MG tablet  Every 12 hours        10/03/21 0138              Gilda Crease, MD 10/03/21 0139

## 2021-10-16 ENCOUNTER — Encounter: Payer: Self-pay | Admitting: Obstetrics and Gynecology

## 2021-10-16 NOTE — Progress Notes (Signed)
Patient did not keep her GYN appointment for 10/16/2021.  Cornelia Copa MD Attending [facility] [employer])

## 2022-01-30 LAB — CYTOLOGY - PAP: Pap: NEGATIVE

## 2022-02-06 ENCOUNTER — Other Ambulatory Visit: Payer: Self-pay | Admitting: Obstetrics and Gynecology

## 2022-02-06 DIAGNOSIS — R1033 Periumbilical pain: Secondary | ICD-10-CM

## 2022-02-07 ENCOUNTER — Ambulatory Visit (HOSPITAL_COMMUNITY)
Admission: RE | Admit: 2022-02-07 | Discharge: 2022-02-07 | Disposition: A | Discharge status: Home / Self Care | Payer: Self-pay | Source: Ambulatory Visit | Attending: Obstetrics and Gynecology | Admitting: Obstetrics and Gynecology

## 2022-02-07 ENCOUNTER — Other Ambulatory Visit (HOSPITAL_COMMUNITY)
Admission: RE | Admit: 2022-02-07 | Discharge: 2022-02-07 | Disposition: A | Discharge status: Home / Self Care | Payer: Self-pay | Source: Ambulatory Visit | Attending: Obstetrics and Gynecology | Admitting: Obstetrics and Gynecology

## 2022-02-07 DIAGNOSIS — R1033 Periumbilical pain: Secondary | ICD-10-CM | POA: Insufficient documentation

## 2022-02-07 DIAGNOSIS — Z3490 Encounter for supervision of normal pregnancy, unspecified, unspecified trimester: Secondary | ICD-10-CM | POA: Insufficient documentation

## 2022-02-07 DIAGNOSIS — N83202 Unspecified ovarian cyst, left side: Secondary | ICD-10-CM | POA: Insufficient documentation

## 2022-02-07 DIAGNOSIS — N7093 Salpingitis and oophoritis, unspecified: Secondary | ICD-10-CM | POA: Insufficient documentation

## 2022-02-07 LAB — HCG, QUANTITATIVE, PREGNANCY: hCG, Beta Chain, Quant, S: <1 m[IU]/mL (ref <5)

## 2022-02-07 MED ORDER — IOHEXOL 300 MG/ML  SOLN
100.0000 mL | Freq: Once | INTRAMUSCULAR | Status: AC | PRN
  Administered 2022-02-07: 100 mL via INTRAVENOUS

## 2022-02-07 MED ORDER — IOHEXOL 9 MG/ML PO SOLN: 1000.0000 mL | Freq: Once | ORAL | Status: DC

## 2022-02-07 MED ORDER — IOHEXOL 9 MG/ML PO SOLN
ORAL | Status: AC
  Filled 2022-02-07: qty 1000

## 2022-02-12 ENCOUNTER — Telehealth: Payer: Self-pay | Admitting: *Deleted

## 2022-02-12 NOTE — Telephone Encounter (Signed)
-----   Message from Warden Fillers, MD sent at 02/09/2022  7:25 PM EST ----- Health department patient, hydrosalpinx versus pyosalpinx noted, needs office visit for discussion of post TOA findings

## 2022-02-12 NOTE — Telephone Encounter (Signed)
I called patient with Interpreter Eda Royal and left a message we have scheduled her an appoinyment on 03/16/22 at 8:55 to discuss results and plan of care.  Nancy Fetter Patient called after hours phone service trying to return call. I called patient with Interpreter Eda Royal and informed her of appointment. She voices understanding. Nancy Fetter

## 2022-03-16 ENCOUNTER — Ambulatory Visit (INDEPENDENT_AMBULATORY_CARE_PROVIDER_SITE_OTHER): Payer: Self-pay | Admitting: Obstetrics and Gynecology

## 2022-03-16 ENCOUNTER — Other Ambulatory Visit (HOSPITAL_COMMUNITY)
Admission: RE | Admit: 2022-03-16 | Discharge: 2022-03-16 | Disposition: A | Discharge status: Home / Self Care | Payer: Self-pay | Source: Ambulatory Visit | Attending: Obstetrics and Gynecology | Admitting: Obstetrics and Gynecology

## 2022-03-16 ENCOUNTER — Encounter: Payer: Self-pay | Admitting: Obstetrics and Gynecology

## 2022-03-16 VITALS — BP 99/65 | HR 49 | Ht 62.0 in | Wt 118.0 lb

## 2022-03-16 DIAGNOSIS — N7011 Chronic salpingitis: Secondary | ICD-10-CM

## 2022-03-16 DIAGNOSIS — Z975 Presence of (intrauterine) contraceptive device: Secondary | ICD-10-CM

## 2022-03-16 DIAGNOSIS — N7093 Salpingitis and oophoritis, unspecified: Secondary | ICD-10-CM | POA: Insufficient documentation

## 2022-03-16 MED ORDER — DOXYCYCLINE HYCLATE 100 MG PO TBEC: 100.0000 mg | DELAYED_RELEASE_TABLET | Freq: Two times a day (BID) | ORAL | 0 refills | Status: AC

## 2022-03-16 NOTE — Progress Notes (Signed)
CC: TOA follow up Subjective:    Patient ID: Diana Carr, female    DOB: 1985-09-13, 36 y.o.   MRN: [id number]  HPI 36 yo G2P2 seen for follow up of TOA in 7/23.  PT received inpatient antibiotics with improvement of symptoms.  She had follow up CT 11/23 with improvement of left TOA/hydrosalpinx; however, no right hydrosalpinx noted.  Pt only notes rare intermittent cramping not needing medication.  She denies dyspareunia.  Pt has copper IUD which has been in place greater than 10 years.  Pt currently does not have insurance.   Review of Systems     Objective:   Physical Exam Constitutional:      Appearance: Normal appearance. She is normal weight.  HENT:     Head: Normocephalic and atraumatic.  Abdominal:     General: Abdomen is flat. There is no distension.     Palpations: Abdomen is soft. There is no mass.     Tenderness: There is no abdominal tenderness. There is no guarding.  Genitourinary:    General: Normal vulva.     Comments: Cervix WNL, no CMT or adnexal pain, vaginal swab taken. Neurological:     Mental Status: She is alert.    Vitals:   03/16/22 0903  BP: 99/65  Pulse: (!) 49   CLINICAL DATA:  Periumbilical pain. Recent history of tubo-ovarian abscess in July.   EXAM: CT PELVIS WITH CONTRAST   TECHNIQUE: Multidetector CT imaging of the pelvis was performed using the standard protocol following the bolus administration of intravenous contrast.   RADIATION DOSE REDUCTION: This exam was performed according to the departmental dose-optimization program which includes automated exposure control, adjustment of the mA and/or kV according to patient size and/or use of iterative reconstruction technique.   CONTRAST:  OMNIPAQUE IOHEXOL 300 MG/ML  SOLN   COMPARISON:  CT 09/25/2021   FINDINGS: Urinary Tract: Unremarkable urinary bladder. No bladder wall thickening.   Bowel: No inflammation or abnormal distention of included large and small  bowel. Normal appendix, series 2, image 26.   Vascular/Lymphatic: No acute vascular findings. There is no pelvic adenopathy.   Reproductive: IUD in the uterus. Enhancing tubular structure in the left adnexa measures 4.1 x 1.1 cm, series 2, image 27. this has diminished in size from prior exam. There is a peripherally enhancing left ovarian cyst measuring 16 mm that is typical of a corpus luteum, but nonspecific in this setting. Thick-walled mildly enhancing tubular structure in the right adnexa, series 4, image 31 and series 5, image 49 suspicious for hydro/pyosalpinx. This is new from prior exam. Anteverted uterus with IUD in place.   Other: Small amount of complex free fluid in the dependent pelvis and both adnexa. No free air.   Musculoskeletal: There are no acute or suspicious osseous abnormalities.   IMPRESSION: 1. Thick-walled mildly enhancing tubular structure in the right adnexa suspicious for hydro/pyosalpinx, new from July CT. The thick walled enhancing left adnexal tubular structure has diminished in size, however given persistence may represent persistent or recurrent hydro/pyosalpinx. 2. Peripherally enhancing 16 mm left ovarian cyst is typical of a corpus luteum, but nonspecific in this setting. 3. Normal appendix.          Assessment & Plan:   1. TOA (tubo-ovarian abscess) Discussed with patient hydrosalpinx are still present but at least the left has improved.  Pt has no new symptoms.  GC/C from initial admission was reviewed and was negative.  GC/C rechecked today due to new right  hydrosalpinx.  Empiric treatment with doxycycline for 7 days as well.  Due to increased probability of adhesions from infection would not move to surgical management unless symptoms worsen.  Pt is considering IUD removal and nexplanon placement through the health department.  I agree with this consideration as she would be at increase risk for ectopic pregnancy due to the damaged  tubes.  Release of information sent to [facility] for recent pap results. Letter given to patient for [facility] stating that it is ok for IUD removal and nexplanon for birth control.   - doxycycline (DORYX) 100 MG EC tablet; Take 1 tablet (100 mg total) by mouth 2 (two) times daily.  Dispense: 14 tablet; Refill: 0 - Cervicovaginal ancillary only  2. Hydrosalpinx     Warden Fillers, MD Faculty Attending, [facility] for [facility]

## 2022-03-20 LAB — CERVICOVAGINAL ANCILLARY ONLY
Chlamydia: NEGATIVE
Comment: NEGATIVE
Comment: NORMAL
Neisseria Gonorrhea: NEGATIVE

## 2022-03-21 ENCOUNTER — Telehealth: Payer: Self-pay

## 2022-03-21 NOTE — Telephone Encounter (Signed)
Call placed with interpreter Eda. Spoke with pt. Pt given results per Dr Elgie Congo. Pt verbalized understanding.  Diana Carr, RNC

## 2022-03-21 NOTE — Telephone Encounter (Signed)
-----   Message from Griffin Basil, MD sent at 03/20/2022  5:49 PM EST ----- Gonorrhea and chlamydia negative, pt will be called with results

## 2022-03-30 ENCOUNTER — Encounter: Payer: Self-pay | Admitting: *Deleted

## 2022-04-09 ENCOUNTER — Encounter: Payer: Self-pay | Admitting: *Deleted
